# Patient Record
Sex: Male | Born: 1985 | Race: White | Hispanic: No | Marital: Married
Health system: Midwestern US, Community
[De-identification: ages and names within clinical notes are randomized; demographics above are authoritative.]

## PROBLEM LIST (undated history)

## (undated) DIAGNOSIS — J329 Chronic sinusitis, unspecified: Principal | ICD-10-CM

## (undated) HISTORY — PX: HERNIA REPAIR: SHX51

---

## 2014-10-27 ENCOUNTER — Ambulatory Visit (INDEPENDENT_AMBULATORY_CARE_PROVIDER_SITE_OTHER): Payer: BC Managed Care – PPO | Admitting: Family Medicine

## 2014-10-27 VITALS — BP 110/70 | HR 58 | Temp 98.6°F | Resp 16 | Ht 67.5 in | Wt 171.6 lb

## 2014-10-27 DIAGNOSIS — Z0000 Encounter for general adult medical examination without abnormal findings: Secondary | ICD-10-CM

## 2014-10-27 DIAGNOSIS — Z23 Encounter for immunization: Secondary | ICD-10-CM

## 2014-10-27 NOTE — Progress Notes (Signed)
Employment physical examination:  History: Patient is here for physical examination. He said that his job wanted him to have a physical exam. They need a note saying that he had it. He was not given any form to complete.  Past medical history: Generally has been healthy with no major medical illnesses. No surgeries. No regular medications. No allergies.  Family history: Mother has hypertension. She is living in [place]. His father and siblings are all living and well, also in their home country  Social history: Patient emigrated here from [place] 2 years ago. His wife is also here, pregnant and due in a few months. He works as a Arboriculturistequipment operator. He does not smoke, drink, or use drugs. He has a college education and speaks English well.  Review of systems: Constitutional: Unremarkable HEENT: Unremarkable Eyes: Unremarkable Respiratory: Unremarkable Cardiovascular: Unremarkable Gastrointestinal: Unremarkable Endocrine: Unremarkable Genitourinary: Unremarkable Musculoskeletal: Unremarkable Dermatologic: Unremarkable Allergy/immunology: Unremarkable Neurology: Unremarkable Hematologic: Unremarkable Psychiatric: Unremarkable   Physical examination: Healthy-appearing young man in no acute distress. His TMs are normal. Eyes PERRLA. EOMs intact. Throat clear. He has a need of some dental work for some cavities. His neck was supple without nodes or thyromegaly. No carotid bruits. Chest clear to auscultation. Heart regular without murmurs gallops or arrhythmias. Abdomen soft without mass or tenderness. No axillary or inguinal nodes. Normal male external genitalia with testes descended. No hernias. Rectal exam not done. Extremities unremarkable. Skin unremarkable except for a tiny blue nevus on his right forehead.  Assessment: Normal physical examination Dental caries  Plan: Check lipids and Cmet. Order flu shot. Return annually or as needed.

## 2014-10-27 NOTE — Patient Instructions (Signed)
Flu shot today  We will let you know the results of your labs  Return as necessary.

## 2014-10-28 LAB — COMPREHENSIVE METABOLIC PANEL
ALBUMIN: 4.4 g/dL (ref 3.5–5.2)
ALK PHOS: 79 U/L (ref 39–117)
ALT: 13 U/L (ref 0–53)
AST: 17 U/L (ref 0–37)
BILIRUBIN TOTAL: 0.5 mg/dL (ref 0.2–1.2)
BUN: 12 mg/dL (ref 6–23)
CO2: 26 mEq/L (ref 19–32)
Calcium: 9.4 mg/dL (ref 8.4–10.5)
Chloride: 101 mEq/L (ref 96–112)
Creat: 0.7 mg/dL (ref 0.50–1.35)
Glucose, Bld: 82 mg/dL (ref 70–99)
POTASSIUM: 4.5 meq/L (ref 3.5–5.3)
Sodium: 137 mEq/L (ref 135–145)
Total Protein: 7.5 g/dL (ref 6.0–8.3)

## 2014-10-28 LAB — LIPID PANEL
CHOL/HDL RATIO: 2.9 ratio
Cholesterol: 113 mg/dL (ref 0–200)
HDL: 39 mg/dL — AB (ref >39)
LDL CALC: 48 mg/dL (ref 0–99)
TRIGLYCERIDES: 131 mg/dL (ref <150)
VLDL: 26 mg/dL (ref 0–40)

## 2016-07-28 DIAGNOSIS — E876 Hypokalemia: Secondary | ICD-10-CM | POA: Diagnosis not present

## 2016-07-28 DIAGNOSIS — J189 Pneumonia, unspecified organism: Secondary | ICD-10-CM | POA: Diagnosis not present

## 2016-07-28 DIAGNOSIS — A419 Sepsis, unspecified organism: Principal | ICD-10-CM | POA: Insufficient documentation

## 2016-07-28 DIAGNOSIS — R509 Fever, unspecified: Secondary | ICD-10-CM | POA: Diagnosis present

## 2016-07-29 ENCOUNTER — Emergency Department (HOSPITAL_COMMUNITY): Payer: BLUE CROSS/BLUE SHIELD

## 2016-07-29 ENCOUNTER — Inpatient Hospital Stay (HOSPITAL_COMMUNITY): Payer: BLUE CROSS/BLUE SHIELD

## 2016-07-29 ENCOUNTER — Encounter (HOSPITAL_COMMUNITY): Payer: Self-pay | Admitting: *Deleted

## 2016-07-29 ENCOUNTER — Observation Stay (HOSPITAL_COMMUNITY)
Admission: EM | Admit: 2016-07-29 | Discharge: 2016-07-31 | Disposition: A | Discharge status: Home / Self Care | Payer: BLUE CROSS/BLUE SHIELD | Attending: Internal Medicine | Admitting: Internal Medicine

## 2016-07-29 DIAGNOSIS — A419 Sepsis, unspecified organism: Secondary | ICD-10-CM | POA: Diagnosis present

## 2016-07-29 DIAGNOSIS — J029 Acute pharyngitis, unspecified: Secondary | ICD-10-CM | POA: Diagnosis present

## 2016-07-29 DIAGNOSIS — E876 Hypokalemia: Secondary | ICD-10-CM | POA: Diagnosis not present

## 2016-07-29 DIAGNOSIS — J189 Pneumonia, unspecified organism: Secondary | ICD-10-CM

## 2016-07-29 DIAGNOSIS — R509 Fever, unspecified: Secondary | ICD-10-CM

## 2016-07-29 LAB — RESPIRATORY PANEL BY PCR
Adenovirus: NOT DETECTED
BORDETELLA PERTUSSIS-RVPCR: NOT DETECTED
CORONAVIRUS HKU1-RVPPCR: NOT DETECTED
CORONAVIRUS NL63-RVPPCR: NOT DETECTED
CORONAVIRUS OC43-RVPPCR: NOT DETECTED
Chlamydophila pneumoniae: NOT DETECTED
Coronavirus 229E: NOT DETECTED
INFLUENZA A H1 2009-RVPPR: NOT DETECTED
INFLUENZA A H3-RVPPCR: NOT DETECTED
INFLUENZA B-RVPPCR: NOT DETECTED
Influenza A H1: NOT DETECTED
Influenza A: NOT DETECTED
METAPNEUMOVIRUS-RVPPCR: NOT DETECTED
MYCOPLASMA PNEUMONIAE-RVPPCR: NOT DETECTED
PARAINFLUENZA VIRUS 1-RVPPCR: NOT DETECTED
PARAINFLUENZA VIRUS 2-RVPPCR: NOT DETECTED
PARAINFLUENZA VIRUS 3-RVPPCR: NOT DETECTED
Parainfluenza Virus 4: NOT DETECTED
RHINOVIRUS / ENTEROVIRUS - RVPPCR: NOT DETECTED
Respiratory Syncytial Virus: NOT DETECTED

## 2016-07-29 LAB — CBC WITH DIFFERENTIAL/PLATELET
BASOS PCT: 0 %
Basophils Absolute: 0 10*3/uL (ref 0.0–0.1)
EOS ABS: 0.2 10*3/uL (ref 0.0–0.7)
Eosinophils Relative: 1 %
HCT: 38.3 % — ABNORMAL LOW (ref 39.0–52.0)
HEMOGLOBIN: 13.4 g/dL (ref 13.0–17.0)
Lymphocytes Relative: 13 %
Lymphs Abs: 1.9 10*3/uL (ref 0.7–4.0)
MCH: 28.9 pg (ref 26.0–34.0)
MCHC: 35 g/dL (ref 30.0–36.0)
MCV: 82.5 fL (ref 78.0–100.0)
Monocytes Absolute: 0.9 10*3/uL (ref 0.1–1.0)
Monocytes Relative: 6 %
NEUTROS PCT: 80 %
Neutro Abs: 12 10*3/uL — ABNORMAL HIGH (ref 1.7–7.7)
PLATELETS: 267 10*3/uL (ref 150–400)
RBC: 4.64 MIL/uL (ref 4.22–5.81)
RDW: 12.6 % (ref 11.5–15.5)
WBC: 15 10*3/uL — AB (ref 4.0–10.5)

## 2016-07-29 LAB — URINE MICROSCOPIC-ADD ON: WBC UA: NONE SEEN WBC/hpf (ref 0–5)

## 2016-07-29 LAB — COMPREHENSIVE METABOLIC PANEL
ALT: 22 U/L (ref 17–63)
AST: 28 U/L (ref 15–41)
Albumin: 3.9 g/dL (ref 3.5–5.0)
Alkaline Phosphatase: 73 U/L (ref 38–126)
Anion gap: 10 (ref 5–15)
BILIRUBIN TOTAL: 0.4 mg/dL (ref 0.3–1.2)
BUN: 9 mg/dL (ref 6–20)
CO2: 25 mmol/L (ref 22–32)
CREATININE: 0.84 mg/dL (ref 0.61–1.24)
Calcium: 8.6 mg/dL — ABNORMAL LOW (ref 8.9–10.3)
Chloride: 102 mmol/L (ref 101–111)
GFR calc Af Amer: >60 mL/min (ref >60)
Glucose, Bld: 133 mg/dL — ABNORMAL HIGH (ref 65–99)
POTASSIUM: 3.4 mmol/L — AB (ref 3.5–5.1)
Sodium: 137 mmol/L (ref 135–145)
TOTAL PROTEIN: 7.1 g/dL (ref 6.5–8.1)

## 2016-07-29 LAB — EXPECTORATED SPUTUM ASSESSMENT W REFEX TO RESP CULTURE

## 2016-07-29 LAB — URINALYSIS, ROUTINE W REFLEX MICROSCOPIC
Bilirubin Urine: NEGATIVE
Glucose, UA: NEGATIVE mg/dL
Hgb urine dipstick: NEGATIVE
Ketones, ur: NEGATIVE mg/dL
LEUKOCYTES UA: NEGATIVE
NITRITE: NEGATIVE
PROTEIN: 30 mg/dL — AB
Specific Gravity, Urine: 1.021 (ref 1.005–1.030)
pH: 6 (ref 5.0–8.0)

## 2016-07-29 LAB — PARASITE EXAM SCREEN, BLOOD-W CONF TO LABCORP (NOT @ ARMC)

## 2016-07-29 LAB — I-STAT CG4 LACTIC ACID, ED
LACTIC ACID, VENOUS: 3.58 mmol/L — AB (ref 0.5–1.9)
Lactic Acid, Venous: 3.16 mmol/L (ref 0.5–1.9)

## 2016-07-29 LAB — EXPECTORATED SPUTUM ASSESSMENT W GRAM STAIN, RFLX TO RESP C

## 2016-07-29 LAB — PROTIME-INR
INR: 1.06
Prothrombin Time: 13.8 seconds (ref 11.4–15.2)

## 2016-07-29 LAB — LACTIC ACID, PLASMA
Lactic Acid, Venous: 2.1 mmol/L (ref 0.5–1.9)
Lactic Acid, Venous: 2.1 mmol/L (ref 0.5–1.9)

## 2016-07-29 LAB — MAGNESIUM: Magnesium: 1.8 mg/dL (ref 1.7–2.4)

## 2016-07-29 LAB — PROCALCITONIN: PROCALCITONIN: 2.18 ng/mL

## 2016-07-29 LAB — SAVE SMEAR

## 2016-07-29 LAB — GLUCOSE, CAPILLARY: GLUCOSE-CAPILLARY: 111 mg/dL — AB (ref 65–99)

## 2016-07-29 MED ORDER — ACETAMINOPHEN 650 MG RE SUPP: 650.0000 mg | Freq: Four times a day (QID) | RECTAL | Status: DC | PRN

## 2016-07-29 MED ORDER — IBUPROFEN 400 MG PO TABS
ORAL_TABLET | ORAL | Status: AC
  Filled 2016-07-29: qty 2

## 2016-07-29 MED ORDER — ENOXAPARIN SODIUM 40 MG/0.4ML ~~LOC~~ SOLN
40.0000 mg | Freq: Every day | SUBCUTANEOUS | Status: DC
Start: 2016-07-29 — End: 2016-07-31
  Administered 2016-07-29 – 2016-07-31 (×3): 40 mg via SUBCUTANEOUS
  Filled 2016-07-29 (×3): qty 0.4

## 2016-07-29 MED ORDER — PIPERACILLIN-TAZOBACTAM 3.375 G IVPB
3.3750 g | Freq: Three times a day (TID) | INTRAVENOUS | Status: DC
Start: 2016-07-29 — End: 2016-07-30
  Administered 2016-07-29 – 2016-07-30 (×3): 3.375 g via INTRAVENOUS
  Filled 2016-07-29 (×6): qty 50

## 2016-07-29 MED ORDER — VANCOMYCIN HCL 10 G IV SOLR
1250.0000 mg | Freq: Two times a day (BID) | INTRAVENOUS | Status: DC
  Administered 2016-07-29 – 2016-07-30 (×3): 1250 mg via INTRAVENOUS
  Filled 2016-07-29 (×4): qty 1250

## 2016-07-29 MED ORDER — SODIUM CHLORIDE 0.9 % IV SOLN: 12.0000 mg/kg | Freq: Three times a day (TID) | INTRAVENOUS | Status: DC

## 2016-07-29 MED ORDER — POLYETHYLENE GLYCOL 3350 17 G PO PACK: 17.0000 g | PACK | Freq: Every day | ORAL | Status: DC | PRN

## 2016-07-29 MED ORDER — SODIUM CHLORIDE 0.9 % IV SOLN: 24.0000 mg/kg | Freq: Once | INTRAVENOUS | Status: DC

## 2016-07-29 MED ORDER — SODIUM CHLORIDE 0.9% FLUSH
3.0000 mL | Freq: Two times a day (BID) | INTRAVENOUS | Status: DC
  Administered 2016-07-29 – 2016-07-31 (×4): 3 mL via INTRAVENOUS

## 2016-07-29 MED ORDER — ONDANSETRON HCL 4 MG/2ML IJ SOLN: 4.0000 mg | Freq: Four times a day (QID) | INTRAMUSCULAR | Status: DC | PRN

## 2016-07-29 MED ORDER — POTASSIUM CHLORIDE IN NACL 20-0.9 MEQ/L-% IV SOLN
INTRAVENOUS | Status: AC
  Administered 2016-07-29: 1000 mL via INTRAVENOUS
  Filled 2016-07-29: qty 1000

## 2016-07-29 MED ORDER — KETOROLAC TROMETHAMINE 30 MG/ML IJ SOLN: 30.0000 mg | Freq: Four times a day (QID) | INTRAMUSCULAR | Status: DC | PRN

## 2016-07-29 MED ORDER — SODIUM CHLORIDE 0.9 % IV BOLUS (SEPSIS)
500.0000 mL | Freq: Once | INTRAVENOUS | Status: AC
  Administered 2016-07-29: 500 mL via INTRAVENOUS

## 2016-07-29 MED ORDER — PIPERACILLIN-TAZOBACTAM 3.375 G IVPB 30 MIN: 3.3750 g | Freq: Once | INTRAVENOUS | Status: DC

## 2016-07-29 MED ORDER — PIPERACILLIN-TAZOBACTAM 3.375 G IVPB 30 MIN
3.3750 g | Freq: Once | INTRAVENOUS | Status: AC
  Administered 2016-07-29: 3.375 g via INTRAVENOUS
  Filled 2016-07-29: qty 50

## 2016-07-29 MED ORDER — ACETAMINOPHEN 325 MG PO TABS: 650.0000 mg | ORAL_TABLET | Freq: Four times a day (QID) | ORAL | Status: DC | PRN

## 2016-07-29 MED ORDER — ONDANSETRON HCL 4 MG PO TABS: 4.0000 mg | ORAL_TABLET | Freq: Four times a day (QID) | ORAL | Status: DC | PRN

## 2016-07-29 MED ORDER — VANCOMYCIN HCL IN DEXTROSE 1-5 GM/200ML-% IV SOLN: 1000.0000 mg | Freq: Once | INTRAVENOUS | Status: DC

## 2016-07-29 MED ORDER — SODIUM CHLORIDE 0.9 % IV BOLUS (SEPSIS)
1000.0000 mL | Freq: Once | INTRAVENOUS | Status: AC
  Administered 2016-07-29: 1000 mL via INTRAVENOUS

## 2016-07-29 MED ORDER — IBUPROFEN 400 MG PO TABS
800.0000 mg | ORAL_TABLET | Freq: Once | ORAL | Status: AC
  Administered 2016-07-29: 800 mg via ORAL

## 2016-07-29 MED ORDER — BISACODYL 5 MG PO TBEC: 5.0000 mg | DELAYED_RELEASE_TABLET | Freq: Every day | ORAL | Status: DC | PRN

## 2016-07-29 MED ORDER — VANCOMYCIN HCL IN DEXTROSE 1-5 GM/200ML-% IV SOLN
1000.0000 mg | Freq: Once | INTRAVENOUS | Status: AC
  Administered 2016-07-29: 1000 mg via INTRAVENOUS
  Filled 2016-07-29: qty 200

## 2016-07-29 MED ORDER — HYDROCODONE-ACETAMINOPHEN 5-325 MG PO TABS: 1.0000 | ORAL_TABLET | ORAL | Status: DC | PRN

## 2016-07-29 MED ORDER — DEXTROSE 5 % IV SOLN
100.0000 mg | Freq: Once | INTRAVENOUS | Status: AC
  Administered 2016-07-29: 100 mg via INTRAVENOUS
  Filled 2016-07-29: qty 100

## 2016-07-29 NOTE — ED Notes (Signed)
Elevated Lactic Acid called from Lab.  Dr. Mora Bellmanni made aware

## 2016-07-29 NOTE — ED Triage Notes (Signed)
Patient presents with c/o generalized weakness, fever and cough

## 2016-07-29 NOTE — H&P (Signed)
History and Physical    Wayne Keller ZDG:[id number] DOB: 1986-06-18 DOA: 07/29/2016  PCP: No PCP Per Patient   Patient coming from: Home   Chief Complaint: Sore throat, cough, fever   HPI: Wayne Keller is a generally healthy 30 y.o. male who denies any significant past medical history and presents to the emergency department for evaluation of fever, sore throat, and cough of one day duration. Patient reports being in his usual state of health, having just returned from visiting family in [place] on 07/25/2016, when he experienced the development of fever with sore throat and occasional cough early on the day of his presentation. He describes the cough is nonproductive, mild, and not associated with any chest pain or dyspnea. Sore throat is described as moderate in severity and with associated tender node in the left neck. He has no difficulty swallowing or speaking with this. He denies sick contacts. He reports receiving typhoid vaccination as a child and again a couple years ago. He did not use malaria prophylaxis on his recent trip to [place]. He grew up in [place] but denies any history of malaria or recent mosquito bite, and reports that his family who continues to live in [place] has never had malaria. Patient denies dyspnea or significant cough, denies abdominal pain, nausea, vomiting, or diarrhea. He also denies dysuria or flank pain. Patient denies rash or headache. There are no red, hot, or swollen joints. He denies any dental pain.  ED Course: Upon arrival to the ED, patient is found to be febrile to 39.5 C, saturating well on room air, and with vitals otherwise stable. Chest x-ray is negative for acute cardiopulmonary disease and chemistry panel is notable only for mild hypokalemia. CBC features a leukocytosis to 15,000 and lactic acid is elevated to 3.58. INR is within the normal limits and urinalysis is notable for a mild proteinuria. Patient was given 30 cc/kg NS bolus and blood and  urine cultures were obtained. He was started on empiric treatment with vancomycin and Zosyn, and given the ED physician's concern for malaria, peripheral smear review was requested and treatment with doxycycline and quinidine was ordered. Apparently, there is only enough quinidine in the hospital for one patient, and there is already a confirmed malaria case. Patient has remained hemodynamically stable with normal mentation and no respiratory distress. He will be observed in the hospital for ongoing evaluation and management of sepsis of unknown origin. If this is in fact malaria, it does not meet criteria for severe malaria and antimalarial treatment can be held for now while awaiting confirmatory testing.  Review of Systems:  All other systems reviewed and apart from HPI, are negative.  History reviewed. No pertinent past medical history.  History reviewed. No pertinent surgical history.   reports that he has never smoked. He has never used smokeless tobacco. He reports that he does not drink alcohol or use drugs.  No Known Allergies  Family History  Problem Relation Age of Onset  . Hypertension Mother      Prior to Admission medications   Not on File    Physical Exam: Vitals:   07/29/16 0330 07/29/16 0345 07/29/16 0400 07/29/16 0415  BP: 102/84 115/60 118/55 113/62  Pulse: 99 88 (!) 57 84  Resp: 21 (!) 29 25 24   Temp:    98.3 F (36.8 C)  TempSrc:    Oral  SpO2: 100% 98% 96% 98%  Weight:      Height:  Constitutional: NAD, calm, comfortable.  Eyes: PERTLA, lids and conjunctivae normal ENMT: Mucous membranes are moist. Posterior pharynx clear of any exudate or lesions.   Neck: normal, supple, no masses, no thyromegaly. 1.5 cm tender mobile node in anterior cervical chain on left. Respiratory: clear to auscultation bilaterally, no wheezing, no crackles. Normal respiratory effort.   Cardiovascular: S1 & S2 heard, regular rate and rhythm, no significant. No extremity  edema. 2+ pedal pulses. No significant JVD. Abdomen: No distension, no tenderness, no masses palpated. Bowel sounds normal.  Musculoskeletal: no clubbing / cyanosis. No joint deformity upper and lower extremities. Normal muscle tone.  Skin: no significant rashes, lesions, ulcers. Warm, dry, well-perfused. Neurologic: CN 2-12 grossly intact. Sensation intact, DTR normal. Strength 5/5 in all 4 limbs.  Psychiatric: Normal judgment and insight. Alert and oriented x 3. Normal mood and affect.     Labs on Admission: I have personally reviewed following labs and imaging studies  CBC:  Recent Labs Lab 07/29/16 0029  WBC 15.0*  NEUTROABS 12.0*  HGB 13.4  HCT 38.3*  MCV 82.5  PLT 267   Basic Metabolic Panel:  Recent Labs Lab 07/29/16 0028  NA 137  K 3.4*  CL 102  CO2 25  GLUCOSE 133*  BUN 9  CREATININE 0.84  CALCIUM 8.6*   GFR: Estimated Creatinine Clearance: 125.3 mL/min (by C-G formula based on SCr of 0.84 mg/dL). Liver Function Tests:  Recent Labs Lab 07/29/16 0028  AST 28  ALT 22  ALKPHOS 73  BILITOT 0.4  PROT 7.1  ALBUMIN 3.9   No results for input(s): LIPASE, AMYLASE in the last 168 hours. No results for input(s): AMMONIA in the last 168 hours. Coagulation Profile:  Recent Labs Lab 07/29/16 0155  INR 1.06   Cardiac Enzymes: No results for input(s): CKTOTAL, CKMB, CKMBINDEX, TROPONINI in the last 168 hours. BNP (last 3 results) No results for input(s): PROBNP in the last 8760 hours. HbA1C: No results for input(s): HGBA1C in the last 72 hours. CBG: No results for input(s): GLUCAP in the last 168 hours. Lipid Profile: No results for input(s): CHOL, HDL, LDLCALC, TRIG, CHOLHDL, LDLDIRECT in the last 72 hours. Thyroid Function Tests: No results for input(s): TSH, T4TOTAL, FREET4, T3FREE, THYROIDAB in the last 72 hours. Anemia Panel: No results for input(s): VITAMINB12, FOLATE, FERRITIN, TIBC, IRON, RETICCTPCT in the last 72 hours. Urine analysis:      Component Value Date/Time   COLORURINE YELLOW 07/29/2016 0044   APPEARANCEUR CLOUDY (A) 07/29/2016 0044   LABSPEC 1.021 07/29/2016 0044   PHURINE 6.0 07/29/2016 0044   GLUCOSEU NEGATIVE 07/29/2016 0044   HGBUR NEGATIVE 07/29/2016 0044   BILIRUBINUR NEGATIVE 07/29/2016 0044   KETONESUR NEGATIVE 07/29/2016 0044   PROTEINUR 30 (A) 07/29/2016 0044   NITRITE NEGATIVE 07/29/2016 0044   LEUKOCYTESUR NEGATIVE 07/29/2016 0044   Sepsis Labs: @LABRCNTIP (procalcitonin:4,lacticidven:4) )No results found for this or any previous visit (from the past 240 hour(s)).   Radiological Exams on Admission: Dg Chest 2 View  Result Date: 07/29/2016 CLINICAL DATA:  Fever, congestion and cough for 2 days. EXAM: CHEST  2 VIEW COMPARISON:  None. FINDINGS: The heart size and mediastinal contours are within normal limits. Both lungs are clear. The visualized skeletal structures are unremarkable. IMPRESSION: No acute cardiopulmonary process. Electronically Signed   By: Awilda Metroourtnay  Bloomer M.D.   On: 07/29/2016 00:59    EKG: Not performed, will obtain as appropriate.   Assessment/Plan  1. Sepsis with unknown source  - Presents with fever to 39.5  C, leukocytosis, lactate 3.6 - 30 cc/kg bolus given - Blood and urine cultures incubating  - Empiric vancomycin, Zosyn, and doxycycline administered in ED - Only localizing s/s are sore throat and a tender node in the left anterior cervical chain - Suspect this is likely a viral syndrome, possibly acute pharyngitis, but with the sepsis picture will continue empiric treatment with vanc and Zosyn for now while following cultures  - Trend lactate and PCT - Check respiratory virus panel  - Check peripheral smear for malaria, ordered  - Infection-control with droplet precautions   2. Hypokalemia  - Serum potassium 3.4 on admission  - Supplemental potassium added to IVF  - Check mag level and replete prn     DVT prophylaxis: sq Lovenox  Code Status: Full  Family  Communication: Discussed with patient Disposition Plan: Admit to telemetry  Consults called: None Admission status: Inpatient    Briscoe Deutscher, MD [facility] Pager [phone]  If 7PM-7AM, please contact night-coverage [email or web address] Password [email or web address]  07/29/2016, 4:31 AM

## 2016-07-29 NOTE — ED Provider Notes (Signed)
[facility]-EMERGENCY DEPT Provider Note   CSN: [id number] Arrival date & time: 07/28/16  2349  First Provider Contact:  First MD Initiated Contact with Patient 07/29/16 0111        History   Chief Complaint Chief Complaint  Patient presents with  . Fever  . Cough    HPI Wayne Keller is a 30 y.o. male.  Wayne Keller is a 30 y.o. male with no major medical history presents to the Emergency Department complaining of gradual, persistent, progressively worsening fever, rhinorrhea, sore throat, cough, myalgias, nausea and vomiting onset last night. She reports 3 episodes of nonbloody nonbilious emesis. Patient denies abdominal pain, diarrhea. He reports Tylenol usage for his fever with some improvement. Patient reports that he returned from [place] on 07/25/2016. He denies taking Typhoid or malaria prophylaxis prior to travel.  Patient denies known sick contacts. He reports he is otherwise up-to-date on his vaccines. No aggravating factors.    The history is provided by the patient and medical records. No language interpreter was used.    History reviewed. No pertinent past medical history.  Patient Active Problem List   Diagnosis Date Noted  . Sepsis (HCC) 07/29/2016    History reviewed. No pertinent surgical history.     Home Medications    Prior to Admission medications   Not on File    Family History Family History  Problem Relation Age of Onset  . Hypertension Mother     Social History Social History  Substance Use Topics  . Smoking status: Never Smoker  . Smokeless tobacco: Never Used  . Alcohol use No     Allergies   Review of patient's allergies indicates no known allergies.   Review of Systems Review of Systems  Constitutional: Positive for chills and fever.  HENT: Positive for congestion, rhinorrhea and sore throat.   Respiratory: Positive for cough.   Gastrointestinal: Positive for nausea and vomiting. Negative for diarrhea.  Musculoskeletal:  Positive for myalgias.     Physical Exam Updated Vital Signs BP 102/84   Pulse 99   Temp 99.9 F (37.7 C) (Oral)   Resp 21   Ht 5\' 6"  (1.676 m)   Wt 78 kg   SpO2 100%   BMI 27.75 kg/m   Physical Exam  Constitutional: He appears well-developed and well-nourished. No distress.  Awake, alert, nontoxic appearance  HENT:  Head: Normocephalic and atraumatic.  Right Ear: Tympanic membrane, external ear and ear canal normal.  Left Ear: Tympanic membrane, external ear and ear canal normal.  Nose: Mucosal edema and rhinorrhea present. No epistaxis. Right sinus exhibits no maxillary sinus tenderness and no frontal sinus tenderness. Left sinus exhibits no maxillary sinus tenderness and no frontal sinus tenderness.  Mouth/Throat: Uvula is midline, oropharynx is clear and moist and mucous membranes are normal. Mucous membranes are not pale and not cyanotic. No oropharyngeal exudate, posterior oropharyngeal edema, posterior oropharyngeal erythema or tonsillar abscesses.  Eyes: Conjunctivae are normal. Pupils are equal, round, and reactive to light. No scleral icterus.  Neck: Normal range of motion and full passive range of motion without pain. Neck supple. No spinous process tenderness and no muscular tenderness present. No Brudzinski's sign and no Kernig's sign noted.  No nuchal rigidity or meningeal signs  Cardiovascular: Regular rhythm and intact distal pulses.  Tachycardia present.   Pulses:      Radial pulses are 2+ on the right side, and 2+ on the left side.       Dorsalis pedis pulses are 2+  on the right side, and 2+ on the left side.  Mild tachycardia  Pulmonary/Chest: Effort normal and breath sounds normal. No stridor. No respiratory distress. He has no wheezes.  Clear and equal breath sounds without focal wheezes, rhonchi, rales  Abdominal: Soft. Bowel sounds are normal. He exhibits no mass. There is no tenderness. There is no rebound and no guarding.  Musculoskeletal: Normal range of  motion. He exhibits no edema.  Lymphadenopathy:    He has no cervical adenopathy.  Neurological: He is alert.  Speech is clear and goal oriented Moves extremities without ataxia  Skin: Skin is warm and dry. No petechiae, no purpura and no rash noted. He is not diaphoretic.  Psychiatric: He has a normal mood and affect.  Nursing note and vitals reviewed.    ED Treatments / Results  Labs (all labs ordered are listed, but only abnormal results are displayed) Labs Reviewed  COMPREHENSIVE METABOLIC PANEL - Abnormal; Notable for the following:       Result Value   Potassium 3.4 (*)    Glucose, Bld 133 (*)    Calcium 8.6 (*)    All other components within normal limits  URINALYSIS, ROUTINE W REFLEX MICROSCOPIC (NOT AT [facility]) - Abnormal; Notable for the following:    APPearance CLOUDY (*)    Protein, ur 30 (*)    All other components within normal limits  CBC WITH DIFFERENTIAL/PLATELET - Abnormal; Notable for the following:    WBC 15.0 (*)    HCT 38.3 (*)    Neutro Abs 12.0 (*)    All other components within normal limits  URINE MICROSCOPIC-ADD ON - Abnormal; Notable for the following:    Squamous Epithelial / LPF 0-5 (*)    Bacteria, UA RARE (*)    Casts HYALINE CASTS (*)    All other components within normal limits  I-STAT CG4 LACTIC ACID, ED - Abnormal; Notable for the following:    Lactic Acid, Venous 3.58 (*)    All other components within normal limits  I-STAT CG4 LACTIC ACID, ED - Abnormal; Notable for the following:    Lactic Acid, Venous 3.16 (*)    All other components within normal limits  CULTURE, BLOOD (ROUTINE X 2)  CULTURE, BLOOD (ROUTINE X 2)  URINE CULTURE  PARASITE EXAM SCREEN, BLOOD-W CONF TO [facility] (NOT @ [facility])  PROTIME-INR  SAVE SMEAR  PATHOLOGIST SMEAR REVIEW  PARASITE EXAM, BLOOD    EKG  EKG Interpretation None       Radiology Dg Chest 2 View  Result Date: 07/29/2016 CLINICAL DATA:  Fever, congestion and cough for 2 days. EXAM: CHEST  2 VIEW  COMPARISON:  None. FINDINGS: The heart size and mediastinal contours are within normal limits. Both lungs are clear. The visualized skeletal structures are unremarkable. IMPRESSION: No acute cardiopulmonary process. Electronically Signed   By: Awilda Metro M.D.   On: 07/29/2016 00:59    Procedures Procedures (including critical care time)  Medications Ordered in ED Medications  ibuprofen (ADVIL,MOTRIN) 400 MG tablet (not administered)  quiNIDine gluconate 1,872 mg in sodium chloride 0.9 % 250 mL IVPB (0 mg Intravenous Hold 07/29/16 0333)    Followed by  quiNIDine gluconate 936 mg in sodium chloride 0.9 % 250 mL IVPB (not administered)  doxycycline (VIBRAMYCIN) 100 mg in dextrose 5 % 250 mL IVPB (100 mg Intravenous New Bag/Given 07/29/16 0328)  ibuprofen (ADVIL,MOTRIN) tablet 800 mg (800 mg Oral Given 07/29/16 0009)  sodium chloride 0.9 % bolus 1,000 mL (1,000 mLs  Intravenous New Bag/Given 07/29/16 0235)    And  sodium chloride 0.9 % bolus 1,000 mL (0 mLs Intravenous Stopped 07/29/16 0329)    And  sodium chloride 0.9 % bolus 500 mL (500 mLs Intravenous New Bag/Given 07/29/16 0248)  piperacillin-tazobactam (ZOSYN) IVPB 3.375 g (0 g Intravenous Stopped 07/29/16 0318)  vancomycin (VANCOCIN) IVPB 1000 mg/200 mL premix (1,000 mg Intravenous New Bag/Given 07/29/16 0235)     Initial Impression / Assessment and Plan / ED Course  I have reviewed the triage vital signs and the nursing notes.  Pertinent labs & imaging results that were available during my care of the patient were reviewed by me and considered in my medical decision making (see chart for details).  Clinical Course  Value Comment By Time  Lactic Acid, Venous: (!!) 3.58 Elevated lactic Danecia Underdown, PA-C 08/06 0112  WBC: (!) 15.0 Leukocytosis Dierdre Forth, PA-C 08/06 0112  DG Chest 2 View No Pneumonia Dierdre Forth, PA-C 08/06 0112  Temp: (!) 103.1 F (39.5 C) fever Briseida Gittings, PA-C 08/06 0112  Leukocytes, UA:  NEGATIVE No evidence of urinary tract infection. Dierdre Forth, PA-C 08/06 0224   Discussed with pharmacist Fayrene Fearing [phone]) who requests ID consult prior to beginning quinidine.   Dierdre Forth, PA-C 08/06 0236   Discussed with Dr. Antionette Char who will admit to tele inpatient.  Will hold on malaria treatment until confirmation.   Dahlia Client Kaylinn Dedic, PA-C 08/06 [phone]    Pt With febrile illness and elevated lactic acid after recent international travel. Suspect malaria. Patient is receiving sepsis protocol and weight-based fluid resuscitation. No history of CHF. Malaria treatment initiated for resistant species.  Pt will need admission.    Final Clinical Impressions(s) / ED Diagnoses   Final diagnoses:  Fever, unspecified fever cause  Sepsis, due to unspecified organism [facility])    New Prescriptions New Prescriptions   No medications on file     [facility], PA-C 07/29/16 0349    Tomasita Crumble, MD 07/29/16 [phone]

## 2016-07-29 NOTE — Progress Notes (Signed)
Patient ID: Wayne Keller, male   DOB: 1986-09-06, 30 y.o.   MRN: [id number] Pt admitted after midnight. Please refer to admission note done 07/29/2016 for details.  Pt is 30 year old male with no significant past medical history. He returned from visiting family in [place] on 07/25/2016. Shortly thereafter he developed fever, sore throat and nonproductive cough of one day duration. He reported receiving typhoid vaccination as a child and again a couple years ago. He did not use malaria prophylaxis on his recent trip to [place]. No reprots of rash. No headaches. No mental status changes.   In ED, T max was 103.1 F, HR 57, RR 18-33. Blood cultures obtained, peripheral smear sent.  Assessment and Plan:  Fever with relative bradycardia - Differential includes typhoid, legionella; not so sure that this would be malaria considering absence of thrombocytopenia, hyponatremia, rash - Relative bradycardia for pt fever of 103 is concerning for typhoid and legionella in this pt traveling to endemic area - Order placed for CT chest for evaluation of his cough as CXR may not provide a good diagnostic yield - Obtain sputum culture - Obtain legionella UA antigen - Follow up on peripheral smear result - Continue current antibiotics for sepsis coverage - F/U blood culture results  Manson Passeylma Shamarie Call [facility] [phone]

## 2016-07-29 NOTE — Progress Notes (Signed)
Pharmacy Antibiotic Note  Wayne Keller is a 30 y.o. male admitted on 07/29/2016 with fever/cough, possible sepsis.  Pharmacy has been consulted for Vancomycin and Zosyn dosing.  Vancomycin 1 g IV given in ED at  0230  Plan: Vancomycin 1250  mg IV q12h Zosyn 3.375 g IV q8h   Height: 5\' 6"  (167.6 cm) Weight: 171 lb 15.3 oz (78 kg) IBW/kg (Calculated) : 63.8  Temp (24hrs), Avg:100.4 F (38 C), Min:98.3 F (36.8 C), Max:103.1 F (39.5 C)   Recent Labs Lab 07/29/16 0028 07/29/16 0029 07/29/16 0052 07/29/16 0249  WBC  --  15.0*  --   --   CREATININE 0.84  --   --   --   LATICACIDVEN  --   --  3.58* 3.16*    Estimated Creatinine Clearance: 125.3 mL/min (by C-G formula based on SCr of 0.84 mg/dL).    No Known Allergies   Wayne Keller, Wayne Keller 07/29/2016 4:46 AM

## 2016-07-30 ENCOUNTER — Observation Stay (HOSPITAL_COMMUNITY): Payer: BLUE CROSS/BLUE SHIELD

## 2016-07-30 ENCOUNTER — Encounter (HOSPITAL_COMMUNITY): Payer: Self-pay | Admitting: Radiology

## 2016-07-30 DIAGNOSIS — R001 Bradycardia, unspecified: Secondary | ICD-10-CM

## 2016-07-30 DIAGNOSIS — R509 Fever, unspecified: Secondary | ICD-10-CM

## 2016-07-30 DIAGNOSIS — R05 Cough: Secondary | ICD-10-CM

## 2016-07-30 DIAGNOSIS — E876 Hypokalemia: Secondary | ICD-10-CM | POA: Diagnosis not present

## 2016-07-30 DIAGNOSIS — J189 Pneumonia, unspecified organism: Secondary | ICD-10-CM

## 2016-07-30 DIAGNOSIS — D72829 Elevated white blood cell count, unspecified: Secondary | ICD-10-CM | POA: Diagnosis not present

## 2016-07-30 DIAGNOSIS — A419 Sepsis, unspecified organism: Secondary | ICD-10-CM | POA: Diagnosis not present

## 2016-07-30 DIAGNOSIS — J029 Acute pharyngitis, unspecified: Secondary | ICD-10-CM

## 2016-07-30 LAB — GLUCOSE, CAPILLARY: GLUCOSE-CAPILLARY: 87 mg/dL (ref 65–99)

## 2016-07-30 LAB — CBC WITH DIFFERENTIAL/PLATELET
Basophils Absolute: 0.1 10*3/uL (ref 0.0–0.1)
Basophils Relative: 1 %
EOS PCT: 4 %
Eosinophils Absolute: 0.3 10*3/uL (ref 0.0–0.7)
HCT: 37 % — ABNORMAL LOW (ref 39.0–52.0)
Hemoglobin: 12.8 g/dL — ABNORMAL LOW (ref 13.0–17.0)
LYMPHS ABS: 2.2 10*3/uL (ref 0.7–4.0)
LYMPHS PCT: 29 %
MCH: 28.7 pg (ref 26.0–34.0)
MCHC: 34.6 g/dL (ref 30.0–36.0)
MCV: 83 fL (ref 78.0–100.0)
MONO ABS: 0.9 10*3/uL (ref 0.1–1.0)
MONOS PCT: 12 %
Neutro Abs: 4.2 10*3/uL (ref 1.7–7.7)
Neutrophils Relative %: 54 %
PLATELETS: 245 10*3/uL (ref 150–400)
RBC: 4.46 MIL/uL (ref 4.22–5.81)
RDW: 12.6 % (ref 11.5–15.5)
WBC: 7.7 10*3/uL (ref 4.0–10.5)

## 2016-07-30 LAB — CRYPTOCOCCAL ANTIGEN: CRYPTO AG: NEGATIVE

## 2016-07-30 LAB — URINE CULTURE: CULTURE: NO GROWTH

## 2016-07-30 LAB — PATHOLOGIST SMEAR REVIEW

## 2016-07-30 LAB — HIV ANTIBODY (ROUTINE TESTING W REFLEX): HIV SCREEN 4TH GENERATION: NONREACTIVE

## 2016-07-30 LAB — LEGIONELLA PNEUMOPHILA SEROGP 1 UR AG: L. PNEUMOPHILA SEROGP 1 UR AG: NEGATIVE

## 2016-07-30 MED ORDER — DIATRIZOATE MEGLUMINE & SODIUM 66-10 % PO SOLN
ORAL | Status: AC
  Filled 2016-07-30: qty 30

## 2016-07-30 MED ORDER — AZITHROMYCIN 500 MG PO TABS
250.0000 mg | ORAL_TABLET | Freq: Every day | ORAL | Status: DC
  Administered 2016-07-31: 250 mg via ORAL
  Filled 2016-07-30: qty 1

## 2016-07-30 MED ORDER — AZITHROMYCIN 1 G PO PACK: 1.0000 g | PACK | Freq: Once | ORAL | Status: DC

## 2016-07-30 MED ORDER — AZITHROMYCIN 500 MG PO TABS
500.0000 mg | ORAL_TABLET | Freq: Every day | ORAL | Status: AC
Start: 2016-07-30 — End: 2016-07-30
  Administered 2016-07-30: 500 mg via ORAL
  Filled 2016-07-30: qty 1

## 2016-07-30 MED ORDER — IOPAMIDOL (ISOVUE-300) INJECTION 61%
INTRAVENOUS | Status: AC
Start: 2016-07-30 — End: 2016-07-30
  Administered 2016-07-30: 100 mL
  Filled 2016-07-30: qty 100

## 2016-07-30 NOTE — Consult Note (Signed)
Date of Admission:  07/29/2016  Date of Consult:  07/30/2016  Reason for Consult: FUO, sore throat and cough Referring Physician: Dr. Charlies Silvers   HPI: Wayne Keller is an 30 y.o. male of [place] descent who had just returned from [place] on 07/25/16 where he had been visiting his family with his wife and child. He states that he felt fine while in [place] but became profoundly fatigued upon return from [place] something that he attributed to prolonged trip with frequent delays  In flying back to [place] via [place]. Note he did not travel via [place] going to or from [place].   He then noticed onset of fever, sore throat and dry cough just 1 day prior to presentation to the ED he was febrile to 103 but otherwise fairly bening VS other than a few elevate RR documented.  Patient was seen in the ED where his labs showed WBC of 15k with ANC of 12, no anemia or TTpenia. His CMP normal. His lactic acid was elevated to 3.58. His pro calcitonin was 2.18.  Blood cultures drawn (and no growth x 1 day). Urine culture done despite ZERO urinary symptoms!  He had malaria smear done which was negative. Respiratory panel was negative.  His HIV ab/ag is negative and crypto ag in serum is negative.   He had CT chest which showed patchy tree in bud opacities. I ordered a CT of his abdomen based on severity of his labs and it was completely benign. I have DC his broad spectrum vancomycin and zosyn and changed him to azithromycin.  He tells me that he has had similar to this one and that "everything is always ok."     History reviewed. No pertinent past medical history.  History reviewed. No pertinent surgical history.  Social History:  reports that he has never smoked. He has never used smokeless tobacco. He reports that he does not drink alcohol or use drugs.   Family History  Problem Relation Age of Onset  . Hypertension Mother    No hx of RA, SLE   Not on File   Medications: I have  reviewed patients current medications as documented in Epic Anti-infectives    Start     Dose/Rate Route Frequency Ordered Stop   07/31/16 1000  azithromycin (ZITHROMAX) tablet 250 mg     250 mg Oral Daily 07/30/16 1304 08/04/16 0959   07/30/16 1315  azithromycin (ZITHROMAX) powder 1 g  Status:  Discontinued     1 g Oral  Once 07/30/16 1304 07/30/16 1304   07/30/16 1315  azithromycin (ZITHROMAX) tablet 500 mg     500 mg Oral Daily 07/30/16 1304 07/30/16 1320   07/29/16 1000  piperacillin-tazobactam (ZOSYN) IVPB 3.375 g  Status:  Discontinued     3.375 g 12.5 mL/hr over 240 Minutes Intravenous Every 8 hours 07/29/16 0450 07/30/16 1042   07/29/16 0800  vancomycin (VANCOCIN) 1,250 mg in sodium chloride 0.9 % 250 mL IVPB  Status:  Discontinued     1,250 mg 166.7 mL/hr over 90 Minutes Intravenous Every 12 hours 07/29/16 0450 07/30/16 1042   07/29/16 0445  piperacillin-tazobactam (ZOSYN) IVPB 3.375 g  Status:  Discontinued     3.375 g 100 mL/hr over 30 Minutes Intravenous  Once 07/29/16 0430 07/29/16 0449   07/29/16 0445  vancomycin (VANCOCIN) IVPB 1000 mg/200 mL premix  Status:  Discontinued     1,000 mg 200 mL/hr over 60 Minutes Intravenous  Once 07/29/16 0430  07/29/16 0449   07/29/16 0230  doxycycline (VIBRAMYCIN) 100 mg in dextrose 5 % 250 mL IVPB     100 mg 125 mL/hr over 120 Minutes Intravenous  Once 07/29/16 0215 07/29/16 0528   07/29/16 0145  piperacillin-tazobactam (ZOSYN) IVPB 3.375 g     3.375 g 100 mL/hr over 30 Minutes Intravenous  Once 07/29/16 0138 07/29/16 0318   07/29/16 0145  vancomycin (VANCOCIN) IVPB 1000 mg/200 mL premix     1,000 mg 200 mL/hr over 60 Minutes Intravenous  Once 07/29/16 0138 07/29/16 0335         ROS:as in HPI otherwise remainder of 12 point Review of Systems is negative   Blood pressure (!) 101/51, pulse (!) 59, temperature 97.9 F (36.6 C), temperature source Oral, resp. rate 18, height 5\' 8"  (1.727 m), weight 176 lb (79.8 kg), SpO2 99  %. General: Alert and awake, oriented x3, not in any acute distress. HEENT: anicteric sclera,  EOMI, oropharynx clear and without exudate Cardiovascular: egular rate, normal r,  no murmur rubs or gallops Pulmonary: clear to auscultation bilaterally, no wheezing, rales or rhonchi Gastrointestinal: soft nontender, nondistended, normal bowel sounds, Musculoskeletal: no  clubbing or edema noted bilaterally Skin, soft tissue: no rashes Neuro: nonfocal, strength and sensation intact   Results for orders placed or performed during the hospital encounter of 07/29/16 (from the past 48 hour(s))  Culture, blood (Routine X 2)     Status: None (Preliminary result)   Collection Time: 07/29/16 12:21 AM  Result Value Ref Range   Specimen Description BLOOD LEFT ARM    Special Requests BOTTLES DRAWN AEROBIC AND ANAEROBIC 6CC     Culture NO GROWTH 1 DAY    Report Status PENDING   Comprehensive metabolic panel     Status: Abnormal   Collection Time: 07/29/16 12:28 AM  Result Value Ref Range   Sodium 137 135 - 145 mmol/L   Potassium 3.4 (L) 3.5 - 5.1 mmol/L   Chloride 102 101 - 111 mmol/L   CO2 25 22 - 32 mmol/L   Glucose, Bld 133 (H) 65 - 99 mg/dL   BUN 9 6 - 20 mg/dL   Creatinine, Ser 09/28/16 0.61 - 1.24 mg/dL   Calcium 8.6 (L) 8.9 - 10.3 mg/dL   Total Protein 7.1 6.5 - 8.1 g/dL   Albumin 3.9 3.5 - 5.0 g/dL   AST 28 15 - 41 U/L   ALT 22 17 - 63 U/L   Alkaline Phosphatase 73 38 - 126 U/L   Total Bilirubin 0.4 0.3 - 1.2 mg/dL   GFR calc non Af Amer >60 >60 mL/min   GFR calc Af Amer >60 >60 mL/min    Comment: (NOTE) The eGFR has been calculated using the CKD EPI equation. This calculation has not been validated in all clinical situations. eGFR's persistently <60 mL/min signify possible Chronic Kidney Disease.    Anion gap 10 5 - 15  CBC with Differential     Status: Abnormal   Collection Time: 07/29/16 12:29 AM  Result Value Ref Range   WBC 15.0 (H) 4.0 - 10.5 K/uL   RBC 4.64 4.22 - 5.81  MIL/uL   Hemoglobin 13.4 13.0 - 17.0 g/dL   HCT 09/28/16 (L) 13.6 - 85.9 %   MCV 82.5 78.0 - 100.0 fL   MCH 28.9 26.0 - 34.0 pg   MCHC 35.0 30.0 - 36.0 g/dL   RDW 92.3 41.4 - 43.6 %   Platelets 267 150 - 400 K/uL   Neutrophils  Relative % 80 %   Neutro Abs 12.0 (H) 1.7 - 7.7 K/uL   Lymphocytes Relative 13 %   Lymphs Abs 1.9 0.7 - 4.0 K/uL   Monocytes Relative 6 %   Monocytes Absolute 0.9 0.1 - 1.0 K/uL   Eosinophils Relative 1 %   Eosinophils Absolute 0.2 0.0 - 0.7 K/uL   Basophils Relative 0 %   Basophils Absolute 0.0 0.0 - 0.1 K/uL  Culture, blood (Routine X 2)     Status: None (Preliminary result)   Collection Time: 07/29/16 12:34 AM  Result Value Ref Range   Specimen Description BLOOD LEFT ARM    Special Requests IN PEDIATRIC BOTTLE 4CC    Culture NO GROWTH 1 DAY    Report Status PENDING   Urinalysis, Routine w reflex microscopic     Status: Abnormal   Collection Time: 07/29/16 12:44 AM  Result Value Ref Range   Color, Urine YELLOW YELLOW   APPearance CLOUDY (A) CLEAR   Specific Gravity, Urine 1.021 1.005 - 1.030   pH 6.0 5.0 - 8.0   Glucose, UA NEGATIVE NEGATIVE mg/dL   Hgb urine dipstick NEGATIVE NEGATIVE   Bilirubin Urine NEGATIVE NEGATIVE   Ketones, ur NEGATIVE NEGATIVE mg/dL   Protein, ur 30 (A) NEGATIVE mg/dL   Nitrite NEGATIVE NEGATIVE   Leukocytes, UA NEGATIVE NEGATIVE  Urine microscopic-add on     Status: Abnormal   Collection Time: 07/29/16 12:44 AM  Result Value Ref Range   Squamous Epithelial / LPF 0-5 (A) NONE SEEN   WBC, UA NONE SEEN 0 - 5 WBC/hpf   RBC / HPF 0-5 0 - 5 RBC/hpf   Bacteria, UA RARE (A) NONE SEEN   Casts HYALINE CASTS (A) NEGATIVE  Urine culture     Status: None   Collection Time: 07/29/16 12:45 AM  Result Value Ref Range   Specimen Description URINE, RANDOM    Special Requests NONE    Culture NO GROWTH    Report Status 07/30/2016 FINAL   I-Stat CG4 Lactic Acid, ED     Status: Abnormal   Collection Time: 07/29/16 12:52 AM  Result  Value Ref Range   Lactic Acid, Venous 3.58 (HH) 0.5 - 1.9 mmol/L   Comment NOTIFIED PHYSICIAN   Parasite Exam Screen, Blood-w conf to [facility] (Not @ [facility])     Status: None   Collection Time: 07/29/16  1:55 AM  Result Value Ref Range   Parasite Exam Screen, Blood            Comment: No Plasmodium or other Blood Parasites seen on thin smears. Sent to [facility] for final review of thick and thin smears. RESULT CALLED TO, READ BACK BY AND VERIFIED WITH: M.TOBIAS,RN 0330 07/29/16 M.CAMPBELL   Protime-INR     Status: None   Collection Time: 07/29/16  1:55 AM  Result Value Ref Range   Prothrombin Time 13.8 11.4 - 15.2 seconds   INR 1.06   Save smear     Status: None   Collection Time: 07/29/16  2:18 AM  Result Value Ref Range   Smear Review SMEAR STAINED AND AVAILABLE FOR REVIEW   Pathologist smear review     Status: None   Collection Time: 07/29/16  2:18 AM  Result Value Ref Range   Path Review Leukocytosis with neutrophilia.     Comment: Reviewed by Kalman Drape, M.D. 07/30/16   I-Stat CG4 Lactic Acid, ED     Status: Abnormal   Collection Time: 07/29/16  2:49 AM  Result Value Ref Range   Lactic Acid, Venous 3.16 (HH) 0.5 - 1.9 mmol/L   Comment NOTIFIED PHYSICIAN   Lactic acid, plasma     Status: Abnormal   Collection Time: 07/29/16  4:50 AM  Result Value Ref Range   Lactic Acid, Venous 2.1 (HH) 0.5 - 1.9 mmol/L    Comment: CRITICAL RESULT CALLED TO, READ BACK BY AND VERIFIED WITH: WICKER K,RN 07/29/16 0533 WAYK   Procalcitonin     Status: None   Collection Time: 07/29/16  4:50 AM  Result Value Ref Range   Procalcitonin 2.18 ng/mL    Comment:        Interpretation: PCT > 2 ng/mL: Systemic infection (sepsis) is likely, unless other causes are known. (NOTE)         ICU PCT Algorithm               Non ICU PCT Algorithm    ----------------------------     ------------------------------         PCT < 0.25 ng/mL                 PCT < 0.1 ng/mL     Stopping of  antibiotics            Stopping of antibiotics       strongly encouraged.               strongly encouraged.    ----------------------------     ------------------------------       PCT level decrease by               PCT < 0.25 ng/mL       >= 80% from peak PCT       OR PCT 0.25 - 0.5 ng/mL          Stopping of antibiotics                                             encouraged.     Stopping of antibiotics           encouraged.    ----------------------------     ------------------------------       PCT level decrease by              PCT >= 0.25 ng/mL       < 80% from peak PCT        AND PCT >= 0.5 ng/mL            Continuing antibiotics                                               encouraged.       Continuing antibiotics            encouraged.    ----------------------------     ------------------------------     PCT level increase compared          PCT > 0.5 ng/mL         with peak PCT AND          PCT >= 0.5 ng/mL             Escalation of antibiotics  strongly encouraged.      Escalation of antibiotics        strongly encouraged.   Magnesium     Status: None   Collection Time: 07/29/16  4:50 AM  Result Value Ref Range   Magnesium 1.8 1.7 - 2.4 mg/dL  Respiratory Panel by PCR     Status: None   Collection Time: 07/29/16  5:48 AM  Result Value Ref Range   Adenovirus NOT DETECTED NOT DETECTED   Coronavirus 229E NOT DETECTED NOT DETECTED   Coronavirus HKU1 NOT DETECTED NOT DETECTED   Coronavirus NL63 NOT DETECTED NOT DETECTED   Coronavirus OC43 NOT DETECTED NOT DETECTED   Metapneumovirus NOT DETECTED NOT DETECTED   Rhinovirus / Enterovirus NOT DETECTED NOT DETECTED   Influenza A NOT DETECTED NOT DETECTED   Influenza A H1 NOT DETECTED NOT DETECTED   Influenza A H1 2009 NOT DETECTED NOT DETECTED   Influenza A H3 NOT DETECTED NOT DETECTED   Influenza B NOT DETECTED NOT DETECTED   Parainfluenza Virus 1 NOT DETECTED NOT DETECTED    Parainfluenza Virus 2 NOT DETECTED NOT DETECTED   Parainfluenza Virus 3 NOT DETECTED NOT DETECTED   Parainfluenza Virus 4 NOT DETECTED NOT DETECTED   Respiratory Syncytial Virus NOT DETECTED NOT DETECTED   Bordetella pertussis NOT DETECTED NOT DETECTED   Chlamydophila pneumoniae NOT DETECTED NOT DETECTED   Mycoplasma pneumoniae NOT DETECTED NOT DETECTED  Lactic acid, plasma     Status: Abnormal   Collection Time: 07/29/16  7:24 AM  Result Value Ref Range   Lactic Acid, Venous 2.1 (HH) 0.5 - 1.9 mmol/L    Comment: CRITICAL RESULT CALLED TO, READ BACK BY AND VERIFIED WITH: ANITA MINTZ RN AT 0847 07/29/16 BY WOOLLENK   Glucose, capillary     Status: Abnormal   Collection Time: 07/29/16  7:29 AM  Result Value Ref Range   Glucose-Capillary 111 (H) 65 - 99 mg/dL  HIV antibody     Status: None   Collection Time: 07/29/16 12:24 PM  Result Value Ref Range   HIV Screen 4th Generation wRfx Non Reactive Non Reactive    Comment: (NOTE) Performed At: [facility] [place], [place] [id number] Lindon Romp MD EI:[id number]   Legionella Pneumophila Serogp 1 Ur Ag     Status: None   Collection Time: 07/29/16  4:23 PM  Result Value Ref Range   L. pneumophila Serogp 1 Ur Ag Negative Negative    Comment: (NOTE) Presumptive negative for L. pneumophila serogroup 1 antigen in urine, suggesting no recent or current infection. Legionnaires' disease cannot be ruled out since other serogroups and species may also cause disease. Performed At: [facility] [place], [place] [id number] Lindon Romp MD [id number]   Culture, expectorated sputum-assessment     Status: None   Collection Time: 07/29/16  4:36 PM  Result Value Ref Range   Specimen Description SPUTUM    Special Requests NONE    Sputum evaluation      MICROSCOPIC FINDINGS SUGGEST THAT THIS SPECIMEN IS NOT REPRESENTATIVE OF LOWER RESPIRATORY SECRETIONS. PLEASE RECOLLECT. Gram Stain Report  Called to,Read Back By and Verified With: A MINTZ,RN AT 0149 07/29/16 BY L BENFIELD    Report Status 07/29/2016 FINAL   Glucose, capillary     Status: None   Collection Time: 07/30/16  7:28 AM  Result Value Ref Range   Glucose-Capillary 87 65 - 99 mg/dL  Cryptococcal antigen     Status: None  Collection Time: 07/30/16  3:05 PM  Result Value Ref Range   Crypto Ag NEGATIVE NEGATIVE   Cryptococcal Ag Titer NOT INDICATED NOT INDICATED   '@BRIEFLABTABLE'$ (sdes,specrequest,cult,reptstatus)   ) Recent Results (from the past 720 hour(s))  Culture, blood (Routine X 2)     Status: None (Preliminary result)   Collection Time: 07/29/16 12:21 AM  Result Value Ref Range Status   Specimen Description BLOOD LEFT ARM  Final   Special Requests BOTTLES DRAWN AEROBIC AND ANAEROBIC 6CC   Final   Culture NO GROWTH 1 DAY  Final   Report Status PENDING  Incomplete  Culture, blood (Routine X 2)     Status: None (Preliminary result)   Collection Time: 07/29/16 12:34 AM  Result Value Ref Range Status   Specimen Description BLOOD LEFT ARM  Final   Special Requests IN PEDIATRIC BOTTLE 4CC  Final   Culture NO GROWTH 1 DAY  Final   Report Status PENDING  Incomplete  Urine culture     Status: None   Collection Time: 07/29/16 12:45 AM  Result Value Ref Range Status   Specimen Description URINE, RANDOM  Final   Special Requests NONE  Final   Culture NO GROWTH  Final   Report Status 07/30/2016 FINAL  Final  Respiratory Panel by PCR     Status: None   Collection Time: 07/29/16  5:48 AM  Result Value Ref Range Status   Adenovirus NOT DETECTED NOT DETECTED Final   Coronavirus 229E NOT DETECTED NOT DETECTED Final   Coronavirus HKU1 NOT DETECTED NOT DETECTED Final   Coronavirus NL63 NOT DETECTED NOT DETECTED Final   Coronavirus OC43 NOT DETECTED NOT DETECTED Final   Metapneumovirus NOT DETECTED NOT DETECTED Final   Rhinovirus / Enterovirus NOT DETECTED NOT DETECTED Final   Influenza A NOT DETECTED NOT DETECTED  Final   Influenza A H1 NOT DETECTED NOT DETECTED Final   Influenza A H1 2009 NOT DETECTED NOT DETECTED Final   Influenza A H3 NOT DETECTED NOT DETECTED Final   Influenza B NOT DETECTED NOT DETECTED Final   Parainfluenza Virus 1 NOT DETECTED NOT DETECTED Final   Parainfluenza Virus 2 NOT DETECTED NOT DETECTED Final   Parainfluenza Virus 3 NOT DETECTED NOT DETECTED Final   Parainfluenza Virus 4 NOT DETECTED NOT DETECTED Final   Respiratory Syncytial Virus NOT DETECTED NOT DETECTED Final   Bordetella pertussis NOT DETECTED NOT DETECTED Final   Chlamydophila pneumoniae NOT DETECTED NOT DETECTED Final   Mycoplasma pneumoniae NOT DETECTED NOT DETECTED Final  Culture, expectorated sputum-assessment     Status: None   Collection Time: 07/29/16  4:36 PM  Result Value Ref Range Status   Specimen Description SPUTUM  Final   Special Requests NONE  Final   Sputum evaluation   Final    MICROSCOPIC FINDINGS SUGGEST THAT THIS SPECIMEN IS NOT REPRESENTATIVE OF LOWER RESPIRATORY SECRETIONS. PLEASE RECOLLECT. Gram Stain Report Called to,Read Back By and Verified With: A MINTZ,RN AT 1718 07/29/16 BY L BENFIELD    Report Status 07/29/2016 FINAL  Final     Impression/Recommendation  Principal Problem:   Sepsis, unspecified organism ([place]) Active Problems:   Hypokalemia   Sore throat   Sepsis ([place])   Pyrexia   Wayne Keller is a 30 y.o. male with admission with high fever, sore throat and cough with labs that were much more worrisome than his actual clinical presentation esp his sepsis markers that were sent such as lactic acid and PCT  #1 FUO:  Differential here includes atypical infection due to mycoplasma etc, even viral despite the negative resp panel.   It is possible that he has an occult bactermia that has not yet declared itself but this seems unlikely  He has had EXTENSIVE workup and only target are the lung findings  I will order some other FUO labs including auto-immune labs in  case this is the declaration of auto-immune disease with ANA, RF, ESR, CRP. Ferritin. LDH  I will check hepatitis panel and EBV and CMV though his picture and imaging do not fit with these  I think if he continues to feel well tomorrow he should be dc to complete z pack and can followup with his PCP and if still having fevers with me or one of my colleagues as an outpatient.     07/30/2016, 9:15 PM   Thank you so much for this interesting consult  [place] for [place] [phone] (pager) [phone] (office) 07/30/2016, 9:15 PM  Rhina [place] 07/30/2016, 9:15 PM

## 2016-07-30 NOTE — Progress Notes (Addendum)
Patient ID: Wayne Keller, male   DOB: 02/16/1986, 30 y.o.   MRN: [id number]  PROGRESS NOTE    Wayne Keller  LPF:[id number] DOB: 06/01/1986 DOA: 07/29/2016  PCP: No PCP Per Patient   Brief Narrative:  30 year old male with no significant past medical history. He returned from visiting family in [place] on 07/25/2016. Shortly thereafter he developed fever, sore throat and nonproductive cough of one day duration. He reported receiving typhoid vaccination as a child and again a couple years ago. He did not use malaria prophylaxis on his recent trip to [place]. No reprots of rash. No headaches. No mental status changes.   In ED, T max was 103.1 F, HR 57, RR 18-33. Blood cultures obtained, peripheral smear sent.   Assessment & Plan:  Fever with relative bradycardia / Sepsis, unspecified organism / cough / Leukocytosis - Sepsis criteria met on the admission. Patient on broad-spectrum antibiotics vanco and zosyn  - Pt had fever 8/6 103.1 and bradycardia of 57, concerning for possible legionella, typhoid. He did not get malaria or typhoid vaccination prior to travel to [place] - Legionella panel is pending, respiratory panel normal - CT chest without contrast showed possible atypical infection, MAI - Sputum culture collected but not a good representative sample. Patient may need different way to collect sputum sample but will follow-up with infectious disease and their recommendation. Pt may be required to have 2 sputum cultures - Blood cultures negative so far    DVT prophylaxis: Lovenox subcutaneous Code Status: full code  Family Communication: No family at the bedside Disposition Plan: home in next 24-48 hours    Consultants:   ID  Procedures:   None   Antimicrobials:   Vanco and zosyn 07/29/2016 -->   Subjective: No overnight events.   Objective: Vitals:   07/29/16 1745 07/29/16 2155 07/30/16 0426 07/30/16 0900  BP: (!) 113/47 (!) 111/54 (!) 105/54 (!) 110/56  Pulse:  79 73 76 77  Resp: _0 Temp: 99 F (37.2 C) 99.5 F (37.5 C) 98.6 F (37 C) 98.2 F (36.8 C)  TempSrc: Oral Oral Oral Oral  SpO2: 100% 99% 99% 99%  Weight:  80.7 kg (178 lb)    Height:  _1  (1.727 m)      Intake/Output Summary (Last 24 hours) at 07/30/16 1016 Last data filed at 07/30/16 0954  Gross per 24 hour  Intake              533 ml  Output                0 ml  Net              533 ml   Filed Weights   07/29/16 0111 07/29/16 0544 07/29/16 2155  Weight: 78 kg (171 lb 15.3 oz) 81 kg (178 lb 9.2 oz) 80.7 kg (178 lb)    Examination:  General exam: Appears calm and comfortable  Respiratory system: Clear to auscultation. Respiratory effort normal. Cardiovascular system: S1 & S2 heard, RRR. No JVD Gastrointestinal system: Abdomen is nondistended, soft and nontender. No organomegaly or masses felt. Normal bowel sounds heard. Central nervous system: Alert and oriented. No focal neurological deficits. Extremities: Symmetric 5 x 5 power. Skin: No rashes, lesions or ulcers Psychiatry: Judgement and insight appear normal. Mood & affect appropriate.   Data Reviewed: I have personally reviewed following labs and imaging studies  CBC:  Recent Labs Lab 07/29/16 0029  WBC 15.0*  NEUTROABS 12.0*  HGB 13.4  HCT 38.3*  MCV 82.5  PLT 030   Basic Metabolic Panel:  Recent Labs Lab 07/29/16 0028 07/29/16 0450  NA 137  --   K 3.4*  --   CL 102  --   CO2 25  --   GLUCOSE 133*  --   BUN 9  --   CREATININE 0.84  --   CALCIUM 8.6*  --   MG  --  1.8   GFR: Estimated Creatinine Clearance: 123.3 mL/min (by C-G formula based on SCr of 0.84 mg/dL). Liver Function Tests:  Recent Labs Lab 07/29/16 0028  AST 28  ALT 22  ALKPHOS 73  BILITOT 0.4  PROT 7.1  ALBUMIN 3.9   No results for input(s): LIPASE, AMYLASE in the last 168 hours. No results for input(s): AMMONIA in the last 168 hours. Coagulation Profile:  Recent Labs Lab 07/29/16 0155  INR 1.06    Cardiac Enzymes: No results for input(s): CKTOTAL, CKMB, CKMBINDEX, TROPONINI in the last 168 hours. BNP (last 3 results) No results for input(s): PROBNP in the last 8760 hours. HbA1C: No results for input(s): HGBA1C in the last 72 hours. CBG:  Recent Labs Lab 07/29/16 0729 07/30/16 0728  GLUCAP 111* 87   Lipid Profile: No results for input(s): CHOL, HDL, LDLCALC, TRIG, CHOLHDL, LDLDIRECT in the last 72 hours. Thyroid Function Tests: No results for input(s): TSH, T4TOTAL, FREET4, T3FREE, THYROIDAB in the last 72 hours. Anemia Panel: No results for input(s): VITAMINB12, FOLATE, FERRITIN, TIBC, IRON, RETICCTPCT in the last 72 hours. Urine analysis:    Component Value Date/Time   COLORURINE YELLOW 07/29/2016 0044   APPEARANCEUR CLOUDY (A) 07/29/2016 0044   LABSPEC 1.021 07/29/2016 0044   PHURINE 6.0 07/29/2016 0044   GLUCOSEU NEGATIVE 07/29/2016 0044   HGBUR NEGATIVE 07/29/2016 0044   BILIRUBINUR NEGATIVE 07/29/2016 0044   KETONESUR NEGATIVE 07/29/2016 0044   PROTEINUR 30 (A) 07/29/2016 0044   NITRITE NEGATIVE 07/29/2016 0044   LEUKOCYTESUR NEGATIVE 07/29/2016 0044   Sepsis Labs: _0 (procalcitonin:4,lacticidven:4)   Culture, blood (Routine X 2)     Status: None (Preliminary result)   Collection Time: 07/29/16 12:21 AM  Result Value Ref Range Status   Specimen Description BLOOD LEFT ARM  Final   Special Requests BOTTLES DRAWN AEROBIC AND ANAEROBIC 6CC   Final   Culture NO GROWTH < 24 HOURS  Final   Report Status PENDING  Incomplete  Culture, blood (Routine X 2)     Status: None (Preliminary result)   Collection Time: 07/29/16 12:34 AM  Result Value Ref Range Status   Specimen Description BLOOD LEFT ARM  Final   Special Requests IN PEDIATRIC BOTTLE 4CC  Final   Culture NO GROWTH < 24 HOURS  Final   Report Status PENDING  Incomplete  Respiratory Panel by PCR     Status: None   Collection Time: 07/29/16  5:48 AM  Result Value Ref Range Status   Adenovirus  NOT DETECTED NOT DETECTED Final   Coronavirus 229E NOT DETECTED NOT DETECTED Final   Coronavirus HKU1 NOT DETECTED NOT DETECTED Final   Coronavirus NL63 NOT DETECTED NOT DETECTED Final   Coronavirus OC43 NOT DETECTED NOT DETECTED Final   Metapneumovirus NOT DETECTED NOT DETECTED Final   Rhinovirus / Enterovirus NOT DETECTED NOT DETECTED Final   Influenza A NOT DETECTED NOT DETECTED Final   Influenza A H1 NOT DETECTED NOT DETECTED Final   Influenza A H1 2009 NOT DETECTED NOT DETECTED Final   Influenza A H3 NOT  DETECTED NOT DETECTED Final   Influenza B NOT DETECTED NOT DETECTED Final   Parainfluenza Virus 1 NOT DETECTED NOT DETECTED Final   Parainfluenza Virus 2 NOT DETECTED NOT DETECTED Final   Parainfluenza Virus 3 NOT DETECTED NOT DETECTED Final   Parainfluenza Virus 4 NOT DETECTED NOT DETECTED Final   Respiratory Syncytial Virus NOT DETECTED NOT DETECTED Final   Bordetella pertussis NOT DETECTED NOT DETECTED Final   Chlamydophila pneumoniae NOT DETECTED NOT DETECTED Final   Mycoplasma pneumoniae NOT DETECTED NOT DETECTED Final  Culture, expectorated sputum-assessment     Status: None   Collection Time: 07/29/16  4:36 PM  Result Value Ref Range Status   Specimen Description SPUTUM  Final   Special Requests NONE  Final   Sputum evaluation   Final    MICROSCOPIC FINDINGS SUGGEST THAT THIS SPECIMEN IS NOT REPRESENTATIVE OF LOWER RESPIRATORY SECRETIONS. PLEASE RECOLLECT. Gram Stain Report Called to,Read Back By and Verified With: A MINTZ,RN AT 2330 07/29/16 BY L BENFIELD    Report Status 07/29/2016 FINAL  Final      Radiology Studies: Dg Chest 2 View Result Date: 07/29/2016 CLINICAL DATA: No acute cardiopulmonary process. Electronically Signed   By: Elon Alas M.D.   On: 07/29/2016 00:59   Ct Chest Wo Contrast Result Date: 07/29/2016 CLINICAL DATA:  Patchy tree-in-bud infiltrates are noted bilaterally as described. This is likely related to a more atypical infection such as MAI  or possible fungal infection Electronically Signed   By: Inez Catalina M.D.   On: 07/29/2016 19:48      Scheduled Meds: . enoxaparin (LOVENOX) injection  40 mg Subcutaneous Daily  . piperacillin-tazobactam (ZOSYN)  IV  3.375 g Intravenous Q8H  . sodium chloride flush  3 mL Intravenous Q12H  . vancomycin  1,250 mg Intravenous Q12H   Continuous Infusions:    LOS: 1 day    Time spent: 25 minutes  Greater than 50% of the time spent on counseling and coordinating the care.   Leisa Lenz, MD [facility] Pager [phone]  If 7PM-7AM, please contact night-coverage [email or web address] Password [email or web address] 07/30/2016, 10:16 AM

## 2016-07-31 DIAGNOSIS — E876 Hypokalemia: Secondary | ICD-10-CM

## 2016-07-31 DIAGNOSIS — A419 Sepsis, unspecified organism: Secondary | ICD-10-CM | POA: Diagnosis not present

## 2016-07-31 DIAGNOSIS — J029 Acute pharyngitis, unspecified: Secondary | ICD-10-CM | POA: Diagnosis not present

## 2016-07-31 DIAGNOSIS — R509 Fever, unspecified: Secondary | ICD-10-CM | POA: Diagnosis not present

## 2016-07-31 LAB — SEDIMENTATION RATE: Sed Rate: 35 mm/hr — ABNORMAL HIGH (ref 0–16)

## 2016-07-31 LAB — FERRITIN: FERRITIN: 40 ng/mL (ref 24–336)

## 2016-07-31 LAB — C-REACTIVE PROTEIN: CRP: 7.5 mg/dL — ABNORMAL HIGH (ref <1.0)

## 2016-07-31 MED ORDER — AZITHROMYCIN 250 MG PO TABS: 250.0000 mg | ORAL_TABLET | Freq: Every day | ORAL | 0 refills | Status: AC

## 2016-07-31 NOTE — Discharge Instructions (Signed)
Azithromycin tablets °What is this medicine? °AZITHROMYCIN (az ith roe MYE sin) is a macrolide antibiotic. It is used to treat or prevent certain kinds of bacterial infections. It will not work for colds, flu, or other viral infections. °This medicine may be used for other purposes; ask your health care provider or pharmacist if you have questions. °What should I tell my health care provider before I take this medicine? °They need to know if you have any of these conditions: °-kidney disease °-liver disease °-irregular heartbeat or heart disease °-an unusual or allergic reaction to azithromycin, erythromycin, other macrolide antibiotics, foods, dyes, or preservatives °-pregnant or trying to get pregnant °-breast-feeding °How should I use this medicine? °Take this medicine by mouth with a full glass of water. Follow the directions on the prescription label. The tablets can be taken with food or on an empty stomach. If the medicine upsets your stomach, take it with food. Take your medicine at regular intervals. Do not take your medicine more often than directed. Take all of your medicine as directed even if you think your are better. Do not skip doses or stop your medicine early. Talk to your pediatrician regarding the use of this medicine in children. Special care may be needed. °Overdosage: If you think you have taken too much of this medicine contact a poison control center or emergency room at once. °NOTE: This medicine is only for you. Do not share this medicine with others. °What if I miss a dose? °If you miss a dose, take it as soon as you can. If it is almost time for your next dose, take only that dose. Do not take double or extra doses. °What may interact with this medicine? °Do not take this medicine with any of the following medications: °-lincomycin °This medicine may also interact with the following medications: °-amiodarone °-antacids °-birth control  pills °-cyclosporine °-digoxin °-magnesium °-nelfinavir °-phenytoin °-warfarin °This list may not describe all possible interactions. Give your health care provider a list of all the medicines, herbs, non-prescription drugs, or dietary supplements you use. Also tell them if you smoke, drink alcohol, or use illegal drugs. Some items may interact with your medicine. °What should I watch for while using this medicine? °Tell your doctor or health care professional if your symptoms do not improve. °Do not treat diarrhea with over the counter products. Contact your doctor if you have diarrhea that lasts more than 2 days or if it is severe and watery. °This medicine can make you more sensitive to the sun. Keep out of the sun. If you cannot avoid being in the sun, wear protective clothing and use sunscreen. Do not use sun lamps or tanning beds/booths. °What side effects may I notice from receiving this medicine? °Side effects that you should report to your doctor or health care professional as soon as possible: °-allergic reactions like skin rash, itching or hives, swelling of the face, lips, or tongue °-confusion, nightmares or hallucinations °-dark urine °-difficulty breathing °-hearing loss °-irregular heartbeat or chest pain °-pain or difficulty passing urine °-redness, blistering, peeling or loosening of the skin, including inside the mouth °-white patches or sores in the mouth °-yellowing of the eyes or skin °Side effects that usually do not require medical attention (report to your doctor or health care professional if they continue or are bothersome): °-diarrhea °-dizziness, drowsiness °-headache °-stomach upset or vomiting °-tooth discoloration °-vaginal irritation °This list may not describe all possible side effects. Call your doctor for medical advice about side   effects. You may report side effects to FDA at 1-800-FDA-1088. °Where should I keep my medicine? °Keep out of the reach of children. °Store at room  temperature between 15 and 30 degrees C (59 and 86 degrees F). Throw away any unused medicine after the expiration date. °NOTE: This sheet is a summary. It may not cover all possible information. If you have questions about this medicine, talk to your doctor, pharmacist, or health care provider. °  °© 2016, [employer]. (2013-07-16 15:38:48) ° °

## 2016-07-31 NOTE — Progress Notes (Signed)
Subjective: Patient feels great and would like to go home   Antibiotics:  Anti-infectives    Start     Dose/Rate Route Frequency Ordered Stop   07/31/16 1000  azithromycin (ZITHROMAX) tablet 250 mg     250 mg Oral Daily 07/30/16 1304 08/04/16 0959   07/31/16 0000  azithromycin (ZITHROMAX) 250 MG tablet     250 mg Oral Daily 07/31/16 1259     07/30/16 1315  azithromycin (ZITHROMAX) powder 1 g  Status:  Discontinued     1 g Oral  Once 07/30/16 1304 07/30/16 1304   07/30/16 1315  azithromycin (ZITHROMAX) tablet 500 mg     500 mg Oral Daily 07/30/16 1304 07/30/16 1320   07/29/16 1000  piperacillin-tazobactam (ZOSYN) IVPB 3.375 g  Status:  Discontinued     3.375 g 12.5 mL/hr over 240 Minutes Intravenous Every 8 hours 07/29/16 0450 07/30/16 1042   07/29/16 0800  vancomycin (VANCOCIN) 1,250 mg in sodium chloride 0.9 % 250 mL IVPB  Status:  Discontinued     1,250 mg 166.7 mL/hr over 90 Minutes Intravenous Every 12 hours 07/29/16 0450 07/30/16 1042   07/29/16 0445  piperacillin-tazobactam (ZOSYN) IVPB 3.375 g  Status:  Discontinued     3.375 g 100 mL/hr over 30 Minutes Intravenous  Once 07/29/16 0430 07/29/16 0449   07/29/16 0445  vancomycin (VANCOCIN) IVPB 1000 mg/200 mL premix  Status:  Discontinued     1,000 mg 200 mL/hr over 60 Minutes Intravenous  Once 07/29/16 0430 07/29/16 0449   07/29/16 0230  doxycycline (VIBRAMYCIN) 100 mg in dextrose 5 % 250 mL IVPB     100 mg 125 mL/hr over 120 Minutes Intravenous  Once 07/29/16 0215 07/29/16 0528   07/29/16 0145  piperacillin-tazobactam (ZOSYN) IVPB 3.375 g     3.375 g 100 mL/hr over 30 Minutes Intravenous  Once 07/29/16 0138 07/29/16 0318   07/29/16 0145  vancomycin (VANCOCIN) IVPB 1000 mg/200 mL premix     1,000 mg 200 mL/hr over 60 Minutes Intravenous  Once 07/29/16 0138 07/29/16 0335      Medications: Scheduled Meds: . azithromycin  250 mg Oral Daily  . enoxaparin (LOVENOX) injection  40 mg Subcutaneous Daily  . sodium  chloride flush  3 mL Intravenous Q12H   Continuous Infusions:  PRN Meds:.acetaminophen **OR** acetaminophen, bisacodyl, HYDROcodone-acetaminophen, ketorolac, ondansetron **OR** ondansetron (ZOFRAN) IV, polyethylene glycol    Objective: Weight change: -2 lb (-0.907 kg)  Intake/Output Summary (Last 24 hours) at 07/31/16 1750 Last data filed at 07/31/16 1300  Gross per 24 hour  Intake              960 ml  Output                0 ml  Net              960 ml   Blood pressure 124/66, pulse 78, temperature 98 F (36.7 C), temperature source Oral, resp. rate 18, height 5\' 8"  (1.727 m), weight 176 lb (79.8 kg), SpO2 100 %. Temp:  [97.9 F (36.6 C)-98 F (36.7 C)] 98 F (36.7 C) (08/08 1000) Pulse Rate:  [59-83] 78 (08/08 1000) Resp:  [16-18] 18 (08/08 1000) BP: (101-124)/(51-84) 124/66 (08/08 1000) SpO2:  [99 %-100 %] 100 % (08/08 1000) Weight:  [176 lb (79.8 kg)] 176 lb (79.8 kg) (08/07 2108)  Physical Exam: General: Alert and awake, oriented x3, not in any acute distress. HEENT: anicteric sclera, pupils reactive  to light and accommodation, EOMI CVS regular rate, normal r,  no murmur rubs or gallops Chest: clear to auscultation bilaterally, no wheezing, rales or rhonchi Abdomen: soft nontender, nondistended, normal bowel sounds, Extremities: no  clubbing or edema noted bilaterally Skin: no rashes Lymph: no new lymphadenopathy Neuro: nonfocal  CBC:    BMET  Recent Labs  07/29/16 0028  NA 137  K 3.4*  CL 102  CO2 25  GLUCOSE 133*  BUN 9  CREATININE 0.84  CALCIUM 8.6*     Liver Panel   Recent Labs  07/29/16 0028  PROT 7.1  ALBUMIN 3.9  AST 28  ALT 22  ALKPHOS 73  BILITOT 0.4       Sedimentation Rate  Recent Labs  07/31/16 0515  ESRSEDRATE 35*   C-Reactive Protein  Recent Labs  07/31/16 0515  CRP 7.5*    Micro Results: Recent Results (from the past 720 hour(s))  Culture, blood (Routine X 2)     Status: None (Preliminary result)    Collection Time: 07/29/16 12:21 AM  Result Value Ref Range Status   Specimen Description BLOOD LEFT ARM  Final   Special Requests BOTTLES DRAWN AEROBIC AND ANAEROBIC 6CC   Final   Culture NO GROWTH 2 DAYS  Final   Report Status PENDING  Incomplete  Culture, blood (Routine X 2)     Status: None (Preliminary result)   Collection Time: 07/29/16 12:34 AM  Result Value Ref Range Status   Specimen Description BLOOD LEFT ARM  Final   Special Requests IN PEDIATRIC BOTTLE 4CC  Final   Culture NO GROWTH 2 DAYS  Final   Report Status PENDING  Incomplete  Urine culture     Status: None   Collection Time: 07/29/16 12:45 AM  Result Value Ref Range Status   Specimen Description URINE, RANDOM  Final   Special Requests NONE  Final   Culture NO GROWTH  Final   Report Status 07/30/2016 FINAL  Final  Respiratory Panel by PCR     Status: None   Collection Time: 07/29/16  5:48 AM  Result Value Ref Range Status   Adenovirus NOT DETECTED NOT DETECTED Final   Coronavirus 229E NOT DETECTED NOT DETECTED Final   Coronavirus HKU1 NOT DETECTED NOT DETECTED Final   Coronavirus NL63 NOT DETECTED NOT DETECTED Final   Coronavirus OC43 NOT DETECTED NOT DETECTED Final   Metapneumovirus NOT DETECTED NOT DETECTED Final   Rhinovirus / Enterovirus NOT DETECTED NOT DETECTED Final   Influenza A NOT DETECTED NOT DETECTED Final   Influenza A H1 NOT DETECTED NOT DETECTED Final   Influenza A H1 2009 NOT DETECTED NOT DETECTED Final   Influenza A H3 NOT DETECTED NOT DETECTED Final   Influenza B NOT DETECTED NOT DETECTED Final   Parainfluenza Virus 1 NOT DETECTED NOT DETECTED Final   Parainfluenza Virus 2 NOT DETECTED NOT DETECTED Final   Parainfluenza Virus 3 NOT DETECTED NOT DETECTED Final   Parainfluenza Virus 4 NOT DETECTED NOT DETECTED Final   Respiratory Syncytial Virus NOT DETECTED NOT DETECTED Final   Bordetella pertussis NOT DETECTED NOT DETECTED Final   Chlamydophila pneumoniae NOT DETECTED NOT DETECTED Final    Mycoplasma pneumoniae NOT DETECTED NOT DETECTED Final  Culture, expectorated sputum-assessment     Status: None   Collection Time: 07/29/16  4:36 PM  Result Value Ref Range Status   Specimen Description SPUTUM  Final   Special Requests NONE  Final   Sputum evaluation   Final  MICROSCOPIC FINDINGS SUGGEST THAT THIS SPECIMEN IS NOT REPRESENTATIVE OF LOWER RESPIRATORY SECRETIONS. PLEASE RECOLLECT. Gram Stain Report Called to,Read Back By and Verified With: A MINTZ,RN AT 1718 07/29/16 BY L BENFIELD    Report Status 07/29/2016 FINAL  Final    Studies/Results: Ct Chest Wo Contrast  Result Date: 07/29/2016 CLINICAL DATA:  Worsening fevers EXAM: CT CHEST WITHOUT CONTRAST TECHNIQUE: Multidetector CT imaging of the chest was performed following the standard protocol without IV contrast. COMPARISON:  Plain film from earlier in the same day FINDINGS: Cardiovascular: Somewhat limited due to the lack of IV contrast. The thoracic aorta shows no aneurysmal dilatation. Mediastinum/Nodes: Thoracic inlet is within normal limits. No sizable mediastinal or hilar adenopathy is noted. Minimal residual thymus tissue is noted in the anterior mediastinum. Lungs/Pleura: Multifocal tree-in-bud infiltrates are seen within the left lower lobe and right upper lobe. No focal confluent infiltrate is noted. No sizable effusion is seen. No pneumothorax is noted. Upper Abdomen: Within normal limits. Musculoskeletal: Within normal limits. IMPRESSION: Patchy tree-in-bud infiltrates are noted bilaterally as described. This is likely related to a more atypical infection such as MAI or possible fungal infection Electronically Signed   By: Alcide Clever M.D.   On: 07/29/2016 19:48   Ct Abdomen Pelvis W Contrast  Result Date: 07/30/2016 CLINICAL DATA:  Unknown source of sepsis. EXAM: CT ABDOMEN AND PELVIS WITH CONTRAST TECHNIQUE: Multidetector CT imaging of the abdomen and pelvis was performed using the standard protocol following bolus  administration of intravenous contrast. CONTRAST:  ISOVUE-300 IOPAMIDOL (ISOVUE-300) INJECTION 61% COMPARISON:  Chest CT from yesterday. FINDINGS: Lower chest: The lung bases again demonstrate the patchy tree-in-bud appearance and small airspace nodules in the left lower lobe. No pleural effusion or focal airspace consolidation. The heart is normal in size. No pericardial effusion. The distal esophagus is grossly normal. Hepatobiliary: No focal hepatic lesions or intrahepatic biliary dilatation. The gallbladder is normal. No common bile duct dilatation. Pancreas: No mass, inflammation or ductal dilatation. Spleen: Normal size.  No focal lesions. Adrenals/Urinary Tract: The adrenal glands and kidneys are normal. No renal or obstructing ureteral calculi. No findings for pyelonephritis or hydronephrosis. The bladder is normal. Stomach/Bowel: The stomach, duodenum, small bowel and colon are unremarkable. No inflammatory changes, mass lesions or obstructive findings. The terminal ileum is normal. The appendix is normal. Vascular/Lymphatic: The aorta and branch vessels are normal. The major venous structures are patent. There are small scattered mesenteric and retroperitoneal lymph nodes. No mass or overt adenopathy. Other: No pelvic mass or adenopathy. No free pelvic fluid collections. No inguinal mass or adenopathy. Musculoskeletal: No significant bony findings. IMPRESSION: No acute abdominal/ pelvic findings, mass lesions are lymphadenopathy. Small scattered mesenteric and retroperitoneal lymph nodes are noted. Electronically Signed   By: Rudie Meyer M.D.   On: 07/30/2016 19:10      Assessment/Plan:  INTERVAL HISTORY: CT abdomen benign   Principal Problem:   Sepsis, unspecified organism (HCC) Active Problems:   Hypokalemia   Sore throat   Sepsis (HCC)   FUO (fever of unknown origin)   Atypical pneumonia    Wayne Keller is a 30 y.o. male with  Fever and ST, cough resolving on abx. He  likely simply had an atypical bacterial infection such as with mycoplasma vs viral infection  Fine to DC on zpack.   LOS: 1 day   Acey Lav 07/31/2016, 5:50 PM

## 2016-07-31 NOTE — Discharge Summary (Addendum)
Physician Discharge Summary  Wayne Keller IAX:[id number] DOB: 13-Feb-1986 DOA: 07/29/2016  PCP: No PCP Per Patient  Admit date: 07/29/2016 Discharge date: 07/31/2016  Recommendations for Outpatient Follow-up:  Continue azithromycin on discharge for 4 days Follow up with PCP results of Hepatitis panel, CMV, ANA, RF Information provided about [facility] to follow up in their clinic on discharge   Discharge Diagnoses:  Principal Problem:   Sepsis, unspecified organism ([place]) Active Problems:   Hypokalemia   Sore throat   Sepsis ([place])   FUO (fever of unknown origin)   Atypical pneumonia    Discharge Condition: stable   Diet recommendation: as tolerated   History of present illness:  30 year old male with nosignificant past medical history. He returned from visiting family in [place] on 07/25/2016. Shortly thereafter he developed fever, sore throat and nonproductivecough of one day duration. He reported receiving typhoid vaccination as a child and again a couple years ago. He did not use malaria prophylaxis on his recent trip to [place]. No reprots of rash. No headaches. No mental status changes.   In ED, T max was 103.1 F, HR 57, RR 18-33. Blood cultures obtained, peripheral smear sent.  Hospital Course:   Assessment & Plan:  Fever with relative bradycardia / Sepsis, unspecified organism / cough / Leukocytosis - Sepsis criteria met on the admission. Patient on broad-spectrum antibiotics vanco and zosyn through 8/8 and then changed to azithro per ID - Pt had fever 8/6 103.1 and bradycardia of 57 on admisison, concerning for possible legionella, typhoid. He did not get malaria or typhoid vaccination prior to travel to [place] - Legionella panel is negative, respiratory panel normal, HIV negative, sputum cx not a good specimen - Blood cultures negative so far - CT chest with tree in bud appearance, possible MAI or fungus or atypical infection, per ID okay to continue azithro for 4  days on discharge  - Inflammatory markers ESR and CRP up but ferritin WNL - CMV and RF pending, needs to be followed on outpt basis    DVT prophylaxis: Lovenox subcutaneous Code Status: full code  Family Communication: No family at the bedside Disposition Plan: home in next 24-48 hours    Consultants:   ID  Procedures:   None   Antimicrobials:   Vanco and zosyn 07/29/2016 --> 07/31/2016  Azithromycin 07/31/2016 --> for 4 days on discharge    Signed:  Leisa Lenz, MD  [facility] 07/31/2016, 1:00 PM  Pager #: [phone]  Time spent in minutes: less than 30 minutes    Discharge Exam: Vitals:   07/31/16 0520 07/31/16 1000  BP: 112/66 124/66  Pulse: 74 78  Resp: 16 18  Temp: 98 F (36.7 C) 98 F (36.7 C)   Vitals:   07/30/16 1823 07/30/16 2108 07/31/16 0520 07/31/16 1000  BP: 118/84 (!) 101/51 112/66 124/66  Pulse: 83 (!) 59 74 78  Resp: _0 Temp: 98 F (36.7 C) 97.9 F (36.6 C) 98 F (36.7 C) 98 F (36.7 C)  TempSrc: Oral Oral Oral Oral  SpO2: 100% 99% 100% 100%  Weight:  79.8 kg (176 lb)    Height:        General: Pt is alert, follows commands appropriately, not in acute distress Cardiovascular: Regular rate and rhythm, S1/S2 +, no murmurs Respiratory: Clear to auscultation bilaterally, no wheezing, no crackles, no rhonchi Abdominal: Soft, non tender, non distended, bowel sounds +, no guarding Extremities: no edema, no cyanosis, pulses palpable bilaterally DP  and PT Neuro: Grossly nonfocal  Discharge Instructions  Discharge Instructions    Call MD for:  persistant nausea and vomiting    Complete by:  As directed   Call MD for:  severe uncontrolled pain    Complete by:  As directed   Diet - low sodium heart healthy    Complete by:  As directed   Discharge instructions    Complete by:  As directed   Continue azithromycin on discharge for 4 days Follow up with PCP results of Hepatitis panel, CMV, ANA, RF   Increase activity  slowly    Complete by:  As directed       Medication List    TAKE these medications   azithromycin 250 MG tablet Commonly known as:  ZITHROMAX Take 1 tablet (250 mg total) by mouth daily.      Follow-up Information    [facility]. Schedule an appointment as soon as possible for a visit in 2 week(s).   Contact information: [address] [place] [address] [phone]           The results of significant diagnostics from this hospitalization (including imaging, microbiology, ancillary and laboratory) are listed below for reference.    Significant Diagnostic Studies: Dg Chest 2 View  Result Date: 07/29/2016 CLINICAL DATA:  Fever, congestion and cough for 2 days. EXAM: CHEST  2 VIEW COMPARISON:  None. FINDINGS: The heart size and mediastinal contours are within normal limits. Both lungs are clear. The visualized skeletal structures are unremarkable. IMPRESSION: No acute cardiopulmonary process. Electronically Signed   By: Elon Alas M.D.   On: 07/29/2016 00:59   Ct Chest Wo Contrast  Result Date: 07/29/2016 CLINICAL DATA:  Worsening fevers EXAM: CT CHEST WITHOUT CONTRAST TECHNIQUE: Multidetector CT imaging of the chest was performed following the standard protocol without IV contrast. COMPARISON:  Plain film from earlier in the same day FINDINGS: Cardiovascular: Somewhat limited due to the lack of IV contrast. The thoracic aorta shows no aneurysmal dilatation. Mediastinum/Nodes: Thoracic inlet is within normal limits. No sizable mediastinal or hilar adenopathy is noted. Minimal residual thymus tissue is noted in the anterior mediastinum. Lungs/Pleura: Multifocal tree-in-bud infiltrates are seen within the left lower lobe and right upper lobe. No focal confluent infiltrate is noted. No sizable effusion is seen. No pneumothorax is noted. Upper Abdomen: Within normal limits. Musculoskeletal: Within normal limits. IMPRESSION:  Patchy tree-in-bud infiltrates are noted bilaterally as described. This is likely related to a more atypical infection such as MAI or possible fungal infection Electronically Signed   By: Inez Catalina M.D.   On: 07/29/2016 19:48   Ct Abdomen Pelvis W Contrast  Result Date: 07/30/2016 CLINICAL DATA:  Unknown source of sepsis. EXAM: CT ABDOMEN AND PELVIS WITH CONTRAST TECHNIQUE: Multidetector CT imaging of the abdomen and pelvis was performed using the standard protocol following bolus administration of intravenous contrast. CONTRAST:  162m ISOVUE-300 IOPAMIDOL (ISOVUE-300) INJECTION 61% COMPARISON:  Chest CT from yesterday. FINDINGS: Lower chest: The lung bases again demonstrate the patchy tree-in-bud appearance and small airspace nodules in the left lower lobe. No pleural effusion or focal airspace consolidation. The heart is normal in size. No pericardial effusion. The distal esophagus is grossly normal. Hepatobiliary: No focal hepatic lesions or intrahepatic biliary dilatation. The gallbladder is normal. No common bile duct dilatation. Pancreas: No mass, inflammation or ductal dilatation. Spleen: Normal size.  No focal lesions. Adrenals/Urinary Tract: The adrenal glands and kidneys are normal. No renal  or obstructing ureteral calculi. No findings for pyelonephritis or hydronephrosis. The bladder is normal. Stomach/Bowel: The stomach, duodenum, small bowel and colon are unremarkable. No inflammatory changes, mass lesions or obstructive findings. The terminal ileum is normal. The appendix is normal. Vascular/Lymphatic: The aorta and branch vessels are normal. The major venous structures are patent. There are small scattered mesenteric and retroperitoneal lymph nodes. No mass or overt adenopathy. Other: No pelvic mass or adenopathy. No free pelvic fluid collections. No inguinal mass or adenopathy. Musculoskeletal: No significant bony findings. IMPRESSION: No acute abdominal/ pelvic findings, mass lesions are  lymphadenopathy. Small scattered mesenteric and retroperitoneal lymph nodes are noted. Electronically Signed   By: Marijo Sanes M.D.   On: 07/30/2016 19:10    Microbiology: Recent Results (from the past 240 hour(s))  Culture, blood (Routine X 2)     Status: None (Preliminary result)   Collection Time: 07/29/16 12:21 AM  Result Value Ref Range Status   Specimen Description BLOOD LEFT ARM  Final   Special Requests BOTTLES DRAWN AEROBIC AND ANAEROBIC 6CC   Final   Culture NO GROWTH 2 DAYS  Final   Report Status PENDING  Incomplete  Culture, blood (Routine X 2)     Status: None (Preliminary result)   Collection Time: 07/29/16 12:34 AM  Result Value Ref Range Status   Specimen Description BLOOD LEFT ARM  Final   Special Requests IN PEDIATRIC BOTTLE 4CC  Final   Culture NO GROWTH 2 DAYS  Final   Report Status PENDING  Incomplete  Urine culture     Status: None   Collection Time: 07/29/16 12:45 AM  Result Value Ref Range Status   Specimen Description URINE, RANDOM  Final   Special Requests NONE  Final   Culture NO GROWTH  Final   Report Status 07/30/2016 FINAL  Final  Respiratory Panel by PCR     Status: None   Collection Time: 07/29/16  5:48 AM  Result Value Ref Range Status   Adenovirus NOT DETECTED NOT DETECTED Final   Coronavirus 229E NOT DETECTED NOT DETECTED Final   Coronavirus HKU1 NOT DETECTED NOT DETECTED Final   Coronavirus NL63 NOT DETECTED NOT DETECTED Final   Coronavirus OC43 NOT DETECTED NOT DETECTED Final   Metapneumovirus NOT DETECTED NOT DETECTED Final   Rhinovirus / Enterovirus NOT DETECTED NOT DETECTED Final   Influenza A NOT DETECTED NOT DETECTED Final   Influenza A H1 NOT DETECTED NOT DETECTED Final   Influenza A H1 2009 NOT DETECTED NOT DETECTED Final   Influenza A H3 NOT DETECTED NOT DETECTED Final   Influenza B NOT DETECTED NOT DETECTED Final   Parainfluenza Virus 1 NOT DETECTED NOT DETECTED Final   Parainfluenza Virus 2 NOT DETECTED NOT DETECTED Final    Parainfluenza Virus 3 NOT DETECTED NOT DETECTED Final   Parainfluenza Virus 4 NOT DETECTED NOT DETECTED Final   Respiratory Syncytial Virus NOT DETECTED NOT DETECTED Final   Bordetella pertussis NOT DETECTED NOT DETECTED Final   Chlamydophila pneumoniae NOT DETECTED NOT DETECTED Final   Mycoplasma pneumoniae NOT DETECTED NOT DETECTED Final  Culture, expectorated sputum-assessment     Status: None   Collection Time: 07/29/16  4:36 PM  Result Value Ref Range Status   Specimen Description SPUTUM  Final   Special Requests NONE  Final   Sputum evaluation   Final    MICROSCOPIC FINDINGS SUGGEST THAT THIS SPECIMEN IS NOT REPRESENTATIVE OF LOWER RESPIRATORY SECRETIONS. PLEASE RECOLLECT. Gram Stain Report Called to,Read Back By and Verified  With: A MINTZ,RN AT 1718 07/29/16 BY L BENFIELD    Report Status 07/29/2016 FINAL  Final     Labs: Basic Metabolic Panel:  Recent Labs Lab 07/29/16 0028 07/29/16 0450  NA 137  --   K 3.4*  --   CL 102  --   CO2 25  --   GLUCOSE 133*  --   BUN 9  --   CREATININE 0.84  --   CALCIUM 8.6*  --   MG  --  1.8   Liver Function Tests:  Recent Labs Lab 07/29/16 0028  AST 28  ALT 22  ALKPHOS 73  BILITOT 0.4  PROT 7.1  ALBUMIN 3.9   No results for input(s): LIPASE, AMYLASE in the last 168 hours. No results for input(s): AMMONIA in the last 168 hours. CBC:  Recent Labs Lab 07/29/16 0029 07/30/16 2224  WBC 15.0* 7.7  NEUTROABS 12.0* 4.2  HGB 13.4 12.8*  HCT 38.3* 37.0*  MCV 82.5 83.0  PLT 267 245   Cardiac Enzymes: No results for input(s): CKTOTAL, CKMB, CKMBINDEX, TROPONINI in the last 168 hours. BNP: BNP (last 3 results) No results for input(s): BNP in the last 8760 hours.  ProBNP (last 3 results) No results for input(s): PROBNP in the last 8760 hours.  CBG:  Recent Labs Lab 07/29/16 0729 07/30/16 0728  GLUCAP 111* 87

## 2016-08-01 LAB — EPSTEIN-BARR VIRUS VCA ANTIBODY PANEL
EBV NA IgG: 598 U/mL — ABNORMAL HIGH (ref 0.0–17.9)
EBV VCA IGG: 158 U/mL — AB (ref 0.0–17.9)
EBV VCA IgM: <36 U/mL (ref 0.0–35.9)

## 2016-08-01 LAB — HEPATITIS PANEL, ACUTE
Hep A IgM: NEGATIVE
Hep B C IgM: NEGATIVE
Hepatitis B Surface Ag: NEGATIVE

## 2016-08-01 LAB — ANTIEXTRACTABLE NUCLEAR AG: ENA SM Ab Ser-aCnc: <0.2 AI (ref 0.0–0.9)

## 2016-08-01 LAB — HISTOPLASMA ANTIGEN, URINE: Histoplasma Antigen, urine: 0 ng/mL (ref 0.00–0.49)

## 2016-08-01 LAB — PARASITE EXAM, BLOOD

## 2016-08-01 LAB — CMV IGM

## 2016-08-01 LAB — RHEUMATOID FACTOR: RHEUMATOID FACTOR: 10.3 [IU]/mL (ref 0.0–13.9)

## 2016-08-01 LAB — CMV ANTIBODY, IGG (EIA): CMV AB - IGG: 7.3 U/mL — AB (ref 0.00–0.59)

## 2016-08-03 LAB — CULTURE, BLOOD (ROUTINE X 2)
Culture: NO GROWTH
Culture: NO GROWTH

## 2018-03-16 IMAGING — CT CT ABD-PELV W/ CM
2 of 4 series · 16 of 46 positions shown, 18 images · IV contrast (APPLIED)
Comparison: Chest CT from yesterday.

CLINICAL DATA: Unknown source of sepsis.

EXAM:
CT ABDOMEN AND PELVIS WITH CONTRAST
TECHNIQUE: Multidetector CT imaging of the abdomen and pelvis was performed
using the standard protocol following bolus administration of
intravenous contrast.
CONTRAST:  100mL NLFRF2-JAA IOPAMIDOL (NLFRF2-JAA) INJECTION 61%

[Series 2: abd/ pelvis 5.0 i30f 1 · axial · 0.71mm/px · z∈[+736,+1196]mm · 13 of 100 slices shown, 15 images]
[im 4/100  soft-tissue]
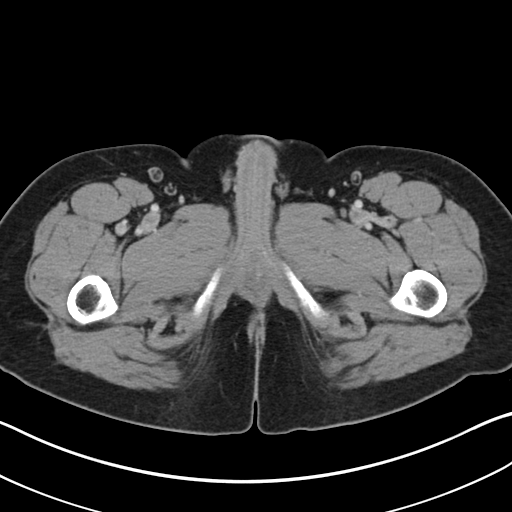
[im 4/100  bone]
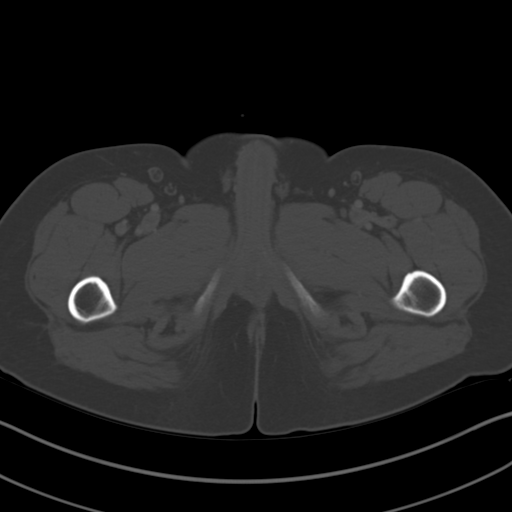
[im 12/100  soft-tissue]
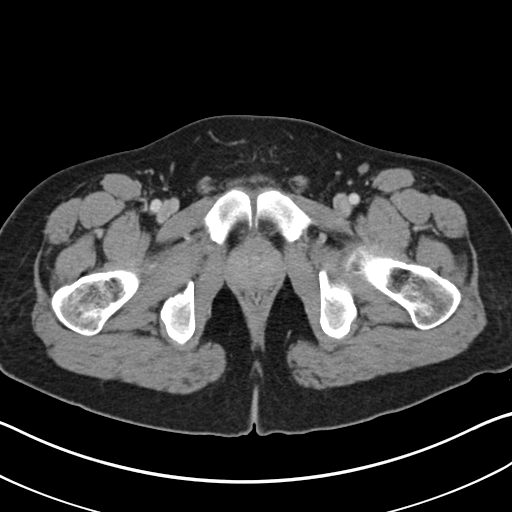
[im 20/100  soft-tissue]
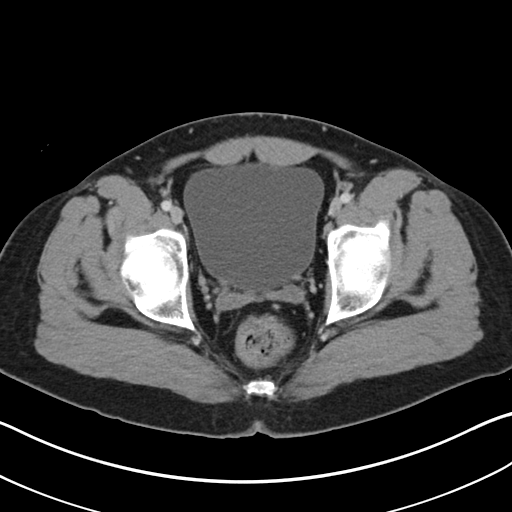
[im 28/100  soft-tissue]
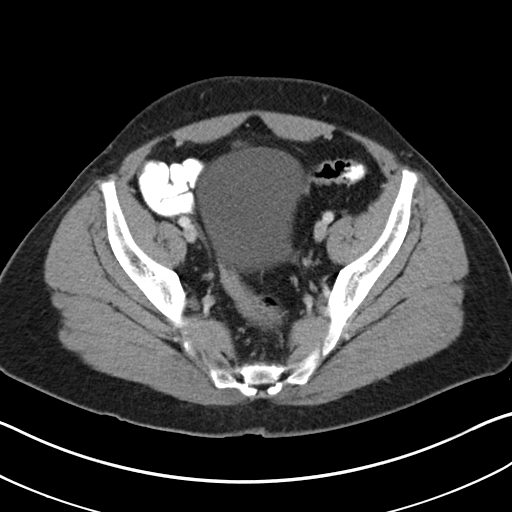
[im 36/100  soft-tissue]
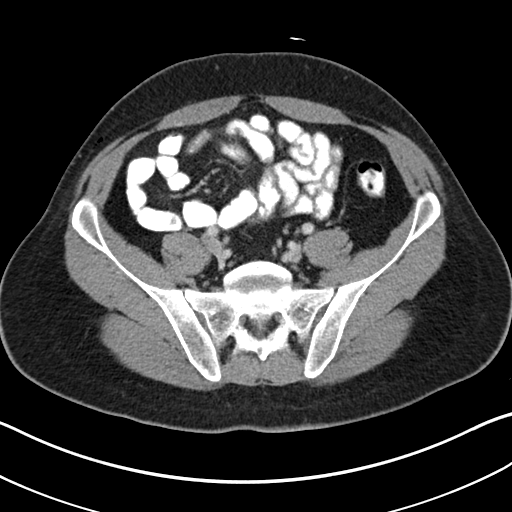
[im 44/100  soft-tissue]
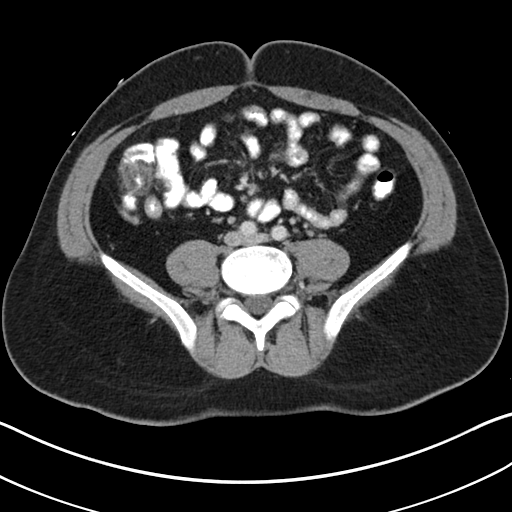
[im 52/100  soft-tissue]
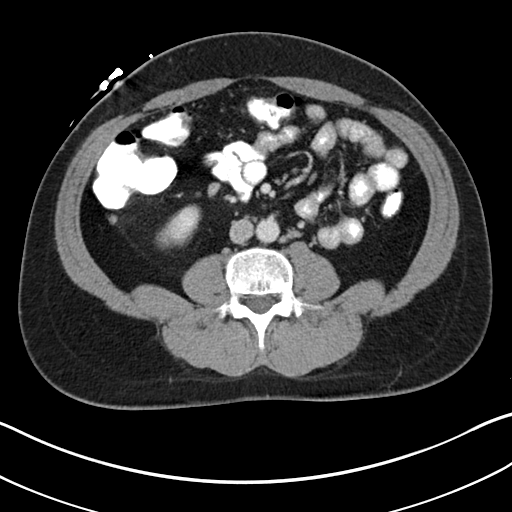
[im 56/100  soft-tissue]
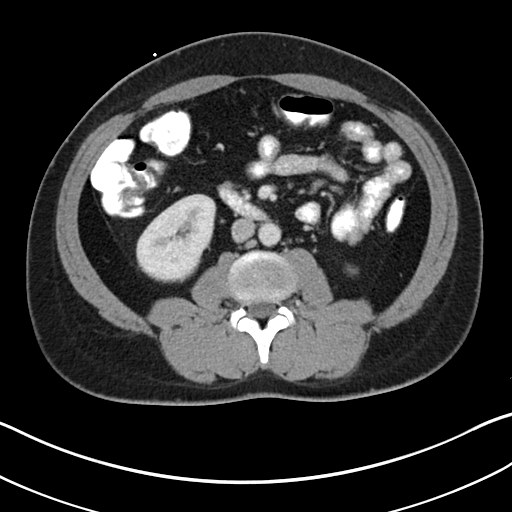
[im 64/100  soft-tissue]
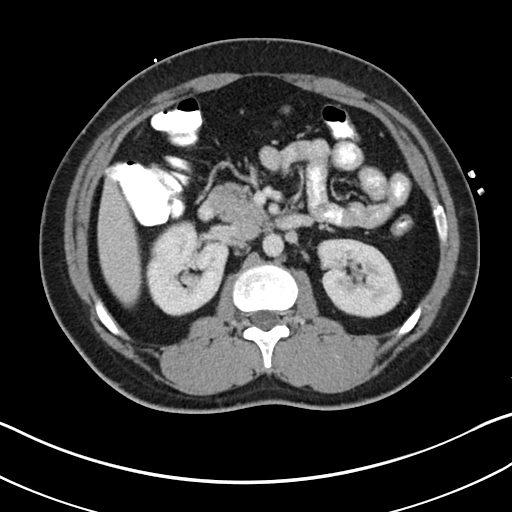
[im 64/100  bone]
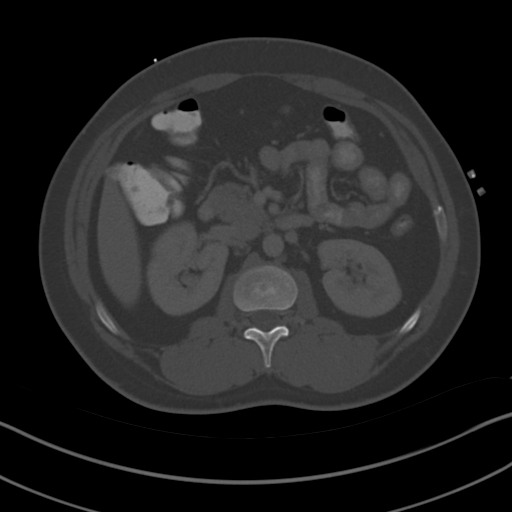
[im 72/100  soft-tissue]
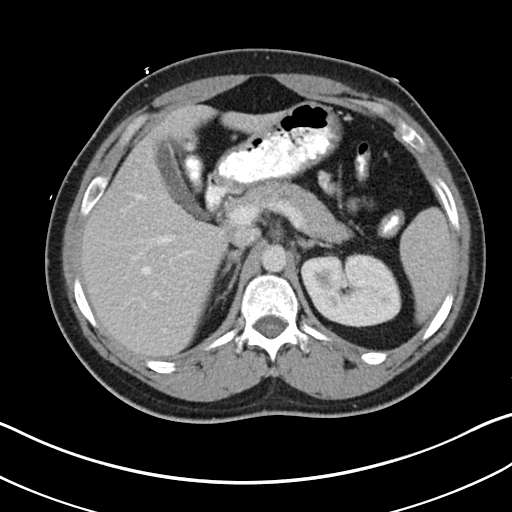
[im 80/100  soft-tissue]
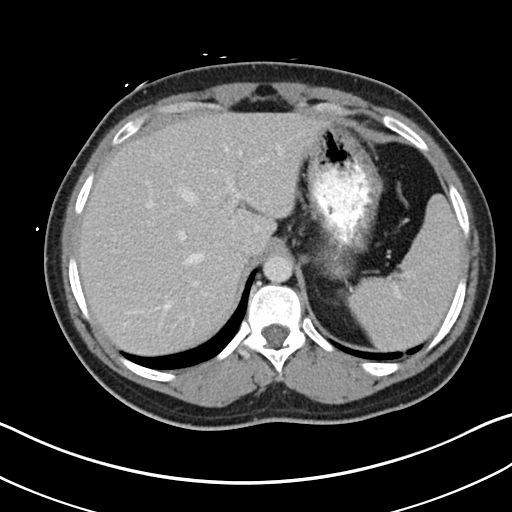
[im 88/100  soft-tissue]
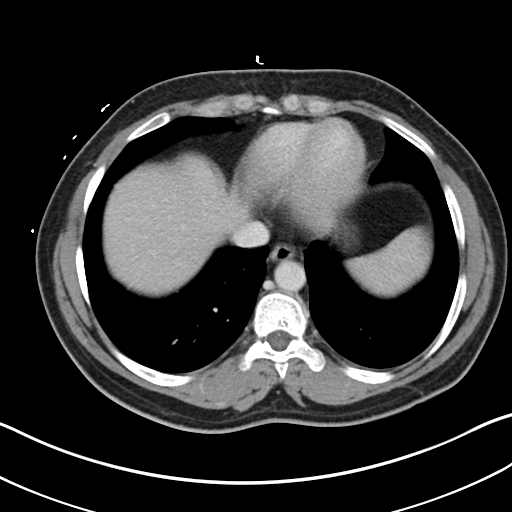
[im 96/100  soft-tissue]
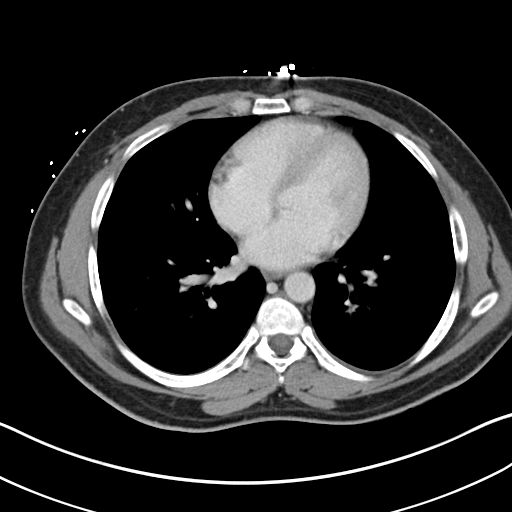

[Series 5: coronal soft tissue · coronal · 0.70mm/px · 3 of 83 slices shown]
[im 28/83  soft-tissue]
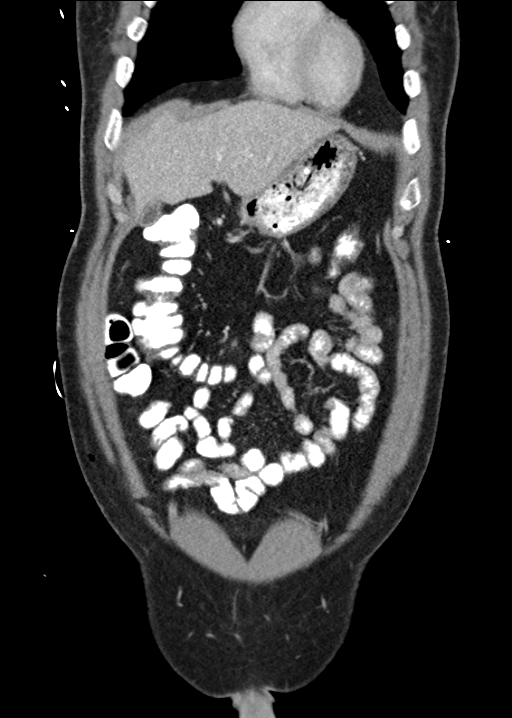
[im 37/83  soft-tissue]
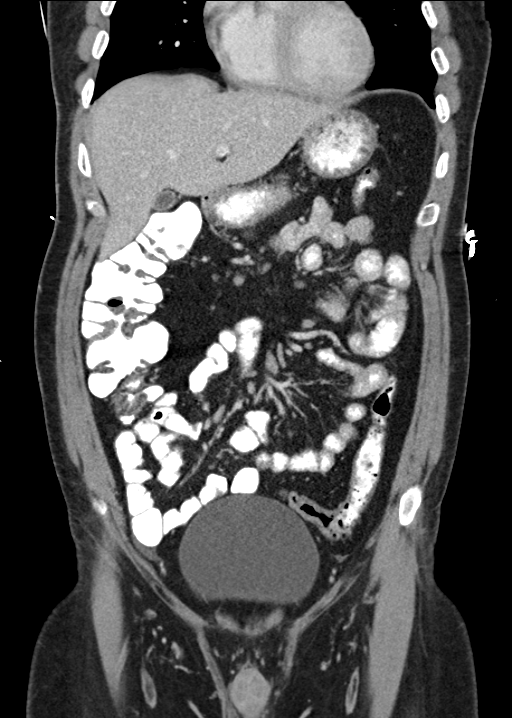
[im 46/83  soft-tissue]
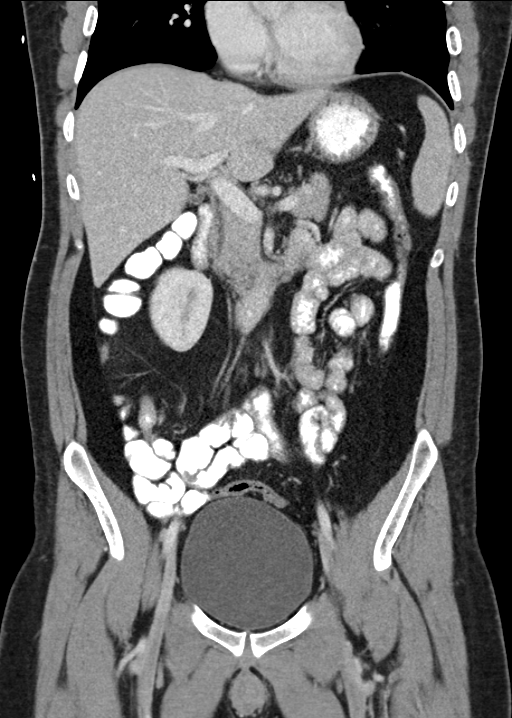

[16 of 46 positions shown; findings below may reference images not displayed]

FINDINGS: Lower chest: The lung bases again demonstrate the patchy tree-in-bud
appearance and small airspace nodules in the left lower lobe. No
pleural effusion or focal airspace consolidation. The heart is
normal in size. No pericardial effusion. The distal esophagus is
grossly normal.

Hepatobiliary: No focal hepatic lesions or intrahepatic biliary
dilatation. The gallbladder is normal. No common bile duct
dilatation.

Pancreas: No mass, inflammation or ductal dilatation.

Spleen: Normal size.  No focal lesions.

Adrenals/Urinary Tract: The adrenal glands and kidneys are normal.
No renal or obstructing ureteral calculi. No findings for
pyelonephritis or hydronephrosis. The bladder is normal.

Stomach/Bowel: The stomach, duodenum, small bowel and colon are
unremarkable. No inflammatory changes, mass lesions or obstructive
findings. The terminal ileum is normal. The appendix is normal.

Vascular/Lymphatic: The aorta and branch vessels are normal. The
major venous structures are patent. There are small scattered
mesenteric and retroperitoneal lymph nodes. No mass or overt
adenopathy.

Other: No pelvic mass or adenopathy. No free pelvic fluid
collections. No inguinal mass or adenopathy.

Musculoskeletal: No significant bony findings.
IMPRESSION: No acute abdominal/ pelvic findings, mass lesions are
lymphadenopathy. Small scattered mesenteric and retroperitoneal
lymph nodes are noted.

## 2020-08-01 ENCOUNTER — Ambulatory Visit: Payer: BLUE CROSS/BLUE SHIELD | Admitting: Neurology

## 2021-05-27 NOTE — Progress Notes (Signed)
Formatting of this note might be different from the original.    Self Swab Type: Anterior Nasal    Time of patient swab  Electronically signed by Deniece Ree at 05/27/2021  4:39 PM EDT

## 2021-10-31 NOTE — Telephone Encounter (Signed)
Formatting of this note might be different from the original.  Disability Form (Completed, Faxed and scanned)    ----- Message from Lorre Nick sent at 10/26/2021  8:50 AM EDT -----  Regarding: FMLA/SHORT-TERM DA  Pt paid & filled out forms for insurance for job today. 10/26/21      Electronically signed by Sela Hua at 10/31/2021  2:19 PM EST

## 2022-01-29 ENCOUNTER — Ambulatory Visit
Admission: EM | Admit: 2022-01-29 | Discharge: 2022-01-29 | Disposition: A | Discharge status: Home / Self Care | Payer: BC Managed Care – PPO | Attending: Emergency Medicine | Admitting: Emergency Medicine

## 2022-01-29 ENCOUNTER — Encounter: Payer: Self-pay | Admitting: Emergency Medicine

## 2022-01-29 DIAGNOSIS — M5431 Sciatica, right side: Secondary | ICD-10-CM

## 2022-01-29 MED ORDER — MELOXICAM 7.5 MG PO TABS: 7.5000 mg | ORAL_TABLET | Freq: Every day | ORAL | 0 refills | Status: AC

## 2022-01-29 MED ORDER — METHOCARBAMOL 500 MG PO TABS: 500.0000 mg | ORAL_TABLET | Freq: Two times a day (BID) | ORAL | 0 refills | Status: AC | PRN

## 2022-01-29 NOTE — Discharge Instructions (Addendum)
Take the meloxicam as directed.    Take the methocarbamol as needed for muscle spasm; Do not drive, operate machinery, or drink alcohol with this medication as it can cause drowsiness.   Follow up with your primary care provider or an orthopedist if your symptoms are not improving.

## 2022-01-29 NOTE — ED Provider Notes (Signed)
Wayne Keller    CSN: [id number] Arrival date & time: 01/29/22  1301      History   Chief Complaint Chief Complaint  Patient presents with   Hip Pain    HPI Wayne Keller is a 36 y.o. male.  Patient presents with right lower back pain that radiates down his right leg x 1 week.  No falls or injury.  The pain is worse with ambulation and improves with rest.  Treatment attempted at home with ibuprofen.  He denies numbness, weakness, saddle anesthesia, loss of bowel/bladder control, abdominal pain, dysuria, hematuria, fever, or other symptoms.  He denies history of similar symptoms.  His medical history includes left inguinal hernia repair 3 months ago.  The history is provided by the patient and medical records.   History reviewed. No pertinent past medical history.  Patient Active Problem List   Diagnosis Date Noted   Atypical pneumonia    Sepsis, unspecified organism ([place]) 07/29/2016   Hypokalemia 07/29/2016   Sore throat 07/29/2016   Sepsis ([place]) 07/29/2016   FUO (fever of unknown origin)     Past Surgical History:  Procedure Laterality Date   HERNIA REPAIR         Home Medications    Prior to Admission medications   Medication Sig Start Date End Date Taking? Authorizing Provider  meloxicam (MOBIC) 7.5 MG tablet Take 1 tablet (7.5 mg total) by mouth daily for 7 days. 01/29/22 02/05/22 Yes Sharion Balloon, NP  methocarbamol (ROBAXIN) 500 MG tablet Take 1 tablet (500 mg total) by mouth 2 (two) times daily as needed for muscle spasms. 01/29/22  Yes Sharion Balloon, NP  azithromycin (ZITHROMAX) 250 MG tablet Take 1 tablet (250 mg total) by mouth daily. 07/31/16   Robbie Lis, MD    Family History Family History  Problem Relation Age of Onset   Hypertension Mother     Social History Social History   Tobacco Use   Smoking status: Never   Smokeless tobacco: Never  Vaping Use   Vaping Use: Never used  Substance Use Topics   Alcohol use: No    Alcohol/week:  0.0 standard drinks   Drug use: No     Allergies   Patient has no known allergies.   Review of Systems Review of Systems  Constitutional:  Negative for chills and fever.  Respiratory:  Negative for cough and shortness of breath.   Cardiovascular:  Negative for chest pain and palpitations.  Gastrointestinal:  Negative for abdominal pain, nausea and vomiting.  Genitourinary:  Negative for dysuria and hematuria.  Musculoskeletal:  Positive for back pain and gait problem. Negative for joint swelling.  Skin:  Negative for color change and rash.  Neurological:  Negative for weakness and numbness.  All other systems reviewed and are negative.   Physical Exam Triage Vital Signs ED Triage Vitals [01/29/22 1335]  Enc Vitals Group     BP      Pulse      Resp      Temp      Temp src      SpO2      Weight      Height      Head Circumference      Peak Flow      Pain Score 7     Pain Loc      Pain Edu?      Excl. in [place]?    No data found.  Updated Vital Signs BP Marland Kitchen)  145/77 (BP Location: Left Arm)    Pulse 66    Temp 98.6 F (37 C) (Oral)    Resp 18    SpO2 97%   Visual Acuity Right Eye Distance:   Left Eye Distance:   Bilateral Distance:    Right Eye Near:   Left Eye Near:    Bilateral Near:     Physical Exam Vitals and nursing note reviewed.  Constitutional:      General: He is not in acute distress.    Appearance: He is well-developed. He is not ill-appearing.  HENT:     Mouth/Throat:     Mouth: Mucous membranes are moist.  Cardiovascular:     Rate and Rhythm: Normal rate and regular rhythm.     Heart sounds: Normal heart sounds.  Pulmonary:     Effort: Pulmonary effort is normal. No respiratory distress.     Breath sounds: Normal breath sounds.  Abdominal:     General: Bowel sounds are normal. There is no distension.     Palpations: Abdomen is soft.     Tenderness: There is no abdominal tenderness. There is no guarding or rebound.  Musculoskeletal:         General: No swelling, tenderness, deformity or signs of injury. Normal range of motion.     Cervical back: Neck supple.  Skin:    General: Skin is warm and dry.     Capillary Refill: Capillary refill takes less than 2 seconds.     Findings: No bruising, erythema, lesion or rash.  Neurological:     General: No focal deficit present.     Mental Status: He is alert and oriented to person, place, and time.     Sensory: No sensory deficit.     Motor: No weakness.     Gait: Gait abnormal.     Comments: Limping gait.   Psychiatric:        Mood and Affect: Mood normal.        Behavior: Behavior normal.     UC Treatments / Results  Labs (all labs ordered are listed, but only abnormal results are displayed) Labs Reviewed - No data to display  EKG   Radiology No results found.  Procedures Procedures (including critical care time)  Medications Ordered in UC Medications - No data to display  Initial Impression / Assessment and Plan / UC Course  I have reviewed the triage vital signs and the nursing notes.  Pertinent labs & imaging results that were available during my care of the patient were reviewed by me and considered in my medical decision making (see chart for details).    Right side sciatica.  No injury or falls.  No indication of infection.  Treating with meloxicam and methocarbamol.  Precautions for drowsiness with methocarbamol discussed.  Instructed patient to follow up with his PCP or an orthopedist if his symptoms are not improving.  He agrees to plan of care.    Final Clinical Impressions(s) / UC Diagnoses   Final diagnoses:  Sciatica of right side     Discharge Instructions      Take the meloxicam as directed.    Take the methocarbamol as needed for muscle spasm; Do not drive, operate machinery, or drink alcohol with this medication as it can cause drowsiness.   Follow up with your primary care provider or an orthopedist if your symptoms are not improving.          ED Prescriptions     Medication  Sig Dispense Auth. Provider   methocarbamol (ROBAXIN) 500 MG tablet Take 1 tablet (500 mg total) by mouth 2 (two) times daily as needed for muscle spasms. 10 tablet Sharion Balloon, NP   meloxicam (MOBIC) 7.5 MG tablet Take 1 tablet (7.5 mg total) by mouth daily for 7 days. 7 tablet Sharion Balloon, NP      PDMP not reviewed this encounter.   Sharion Balloon, NP 01/29/22 1426

## 2022-01-29 NOTE — ED Triage Notes (Signed)
Pt presents with right side hip pain that radiates down his right leg difficult to walk. Pt denies any injury. Pt took ibuprofen for pain control this morning.

## 2022-01-29 NOTE — ED Triage Notes (Signed)
Formatting of this note might be different from the original.  Pt presents with right side hip pain that radiates down his right leg difficult to walk. Pt denies any injury. Pt took ibuprofen for pain control this morning.   Electronically signed by Lanna Poche, RN at 01/29/2022  1:37 PM EST

## 2022-01-29 NOTE — ED Provider Notes (Signed)
Formatting of this note is different from the original.  Images from the original note were not included.    Leonard Meyer    CSN: [id number]  Arrival date & time: 01/29/22  1301        History    Chief Complaint  Chief Complaint   Patient presents with    Hip Pain     HPI  Leonard Meyer is a 36 y.o. male.  Patient presents with right lower back pain that radiates down his right leg x 1 week.  No falls or injury.  The pain is worse with ambulation and improves with rest.  Treatment attempted at home with ibuprofen.  He denies numbness, weakness, saddle anesthesia, loss of bowel/bladder control, abdominal pain, dysuria, hematuria, fever, or other symptoms.  He denies history of similar symptoms.  His medical history includes left inguinal hernia repair 3 months ago.    The history is provided by the patient and medical records.     History reviewed. No pertinent past medical history.    Patient Active Problem List    Diagnosis Date Noted    Atypical pneumonia     Sepsis, unspecified organism (HCC) 07/29/2016    Hypokalemia 07/29/2016    Sore throat 07/29/2016    Sepsis (HCC) 07/29/2016    FUO (fever of unknown origin)      Past Surgical History:   Procedure Laterality Date    HERNIA REPAIR       Home Medications      Prior to Admission medications    Medication Sig Start Date End Date Taking? Authorizing Provider   meloxicam (MOBIC) 7.5 MG tablet Take 1 tablet (7.5 mg total) by mouth daily for 7 days. 01/29/22 02/05/22 Yes Mickie Bail, NP   methocarbamol (ROBAXIN) 500 MG tablet Take 1 tablet (500 mg total) by mouth 2 (two) times daily as needed for muscle spasms. 01/29/22  Yes Mickie Bail, NP   azithromycin (ZITHROMAX) 250 MG tablet Take 1 tablet (250 mg total) by mouth daily. 07/31/16   Alison Murray, MD     Family History  Family History   Problem Relation Age of Onset    Hypertension Mother      Social History  Social History     Tobacco Use    Smoking status: Never    Smokeless tobacco: Never   Vaping Use     Vaping Use: Never used   Substance Use Topics    Alcohol use: No     Alcohol/week: 0.0 standard drinks    Drug use: No     Allergies    Patient has no known allergies.    Review of Systems  Review of Systems   Constitutional:  Negative for chills and fever.   Respiratory:  Negative for cough and shortness of breath.    Cardiovascular:  Negative for chest pain and palpitations.   Gastrointestinal:  Negative for abdominal pain, nausea and vomiting.   Genitourinary:  Negative for dysuria and hematuria.   Musculoskeletal:  Positive for back pain and gait problem. Negative for joint swelling.   Skin:  Negative for color change and rash.   Neurological:  Negative for weakness and numbness.   All other systems reviewed and are negative.    Physical Exam  Triage Vital Signs  ED Triage Vitals [01/29/22 1335]   Enc Vitals Group      BP       Pulse       Resp  Temp       Temp src       SpO2       Weight       Height       Head Circumference       Peak Flow       Pain Score 7      Pain Loc       Pain Edu?       Excl. in GC?      No data found.    Updated Vital Signs  BP (!) 145/77 (BP Location: Left Arm)   Pulse 66   Temp 98.6 F (37 C) (Oral)   Resp 18   SpO2 97%     Visual Acuity  Right Eye Distance:    Left Eye Distance:    Bilateral Distance:      Right Eye Near:    Left Eye Near:     Bilateral Near:       Physical Exam  Vitals and nursing note reviewed.   Constitutional:       General: He is not in acute distress.     Appearance: He is well-developed. He is not ill-appearing.   HENT:      Mouth/Throat:      Mouth: Mucous membranes are moist.   Cardiovascular:      Rate and Rhythm: Normal rate and regular rhythm.      Heart sounds: Normal heart sounds.   Pulmonary:      Effort: Pulmonary effort is normal. No respiratory distress.      Breath sounds: Normal breath sounds.   Abdominal:      General: Bowel sounds are normal. There is no distension.      Palpations: Abdomen is soft.      Tenderness: There is no  abdominal tenderness. There is no guarding or rebound.   Musculoskeletal:         General: No swelling, tenderness, deformity or signs of injury. Normal range of motion.      Cervical back: Neck supple.   Skin:     General: Skin is warm and dry.      Capillary Refill: Capillary refill takes less than 2 seconds.      Findings: No bruising, erythema, lesion or rash.   Neurological:      General: No focal deficit present.      Mental Status: He is alert and oriented to person, place, and time.      Sensory: No sensory deficit.      Motor: No weakness.      Gait: Gait abnormal.      Comments: Limping gait.    Psychiatric:         Mood and Affect: Mood normal.         Behavior: Behavior normal.     UC Treatments / Results   Labs  (all labs ordered are listed, but only abnormal results are displayed)  Labs Reviewed - No data to display    EKG    Radiology  No results found.    Procedures  Procedures (including critical care time)    Medications Ordered in UC  Medications - No data to display    Initial Impression / Assessment and Plan / UC Course   I have reviewed the triage vital signs and the nursing notes.    Pertinent labs & imaging results that were available during my care of the patient were reviewed by me and considered in my medical  decision making (see chart for details).      Right side sciatica.  No injury or falls.  No indication of infection.  Treating with meloxicam and methocarbamol.  Precautions for drowsiness with methocarbamol discussed.  Instructed patient to follow up with his PCP or an orthopedist if his symptoms are not improving.  He agrees to plan of care.      Final Clinical Impressions(s) / UC Diagnoses     Final diagnoses:   Sciatica of right side     Discharge Instructions        Take the meloxicam as directed.      Take the methocarbamol as needed for muscle spasm; Do not drive, operate machinery, or drink alcohol with this medication as it can cause drowsiness.     Follow up with your  primary care provider or an orthopedist if your symptoms are not improving.      ED Prescriptions       Medication Sig Dispense Auth. Provider    methocarbamol (ROBAXIN) 500 MG tablet Take 1 tablet (500 mg total) by mouth 2 (two) times daily as needed for muscle spasms. 10 tablet Mickie Bail, NP    meloxicam (MOBIC) 7.5 MG tablet Take 1 tablet (7.5 mg total) by mouth daily for 7 days. 7 tablet Mickie Bail, NP         PDMP not reviewed this encounter.    Mickie Bail, NP  01/29/22 1426    Electronically signed by Mickie Bail, NP at 01/29/2022  2:26 PM EST

## 2022-02-26 NOTE — Addendum Note (Signed)
Addended by: Angela Cox on: 02/26/2022 08:51 AM     Modules accepted: Orders      Electronically signed by Angela Cox at 02/26/2022  8:51 AM EST

## 2022-02-26 NOTE — Progress Notes (Signed)
Formatting of this note is different from the original.  Subjective     HPI:  Leonard Meyer is a 36 y.o.  male who presents to the office with:    Chief Complaint   Patient presents with   ? sick     Began:Friday evening   Symptoms:sore throat, small cough, runny nose, low grade fever (last night)   At home tests done:No   Taking:tylenol, lemon juice with honey       Sore throat began Friday afternoon.  Reports that it is associated with any mild intermittent cough, runny nose and chills.  Patient reports a tactile fever at home.  No at-home testing.  Patient taking Tylenol and using lemon juice with honey with minimal relief.  Patient has not had any known exposures.  No vomiting or diarrhea.  No difficulty breathing.    PMH: Past medical history, Past surgical history, Social history, family history were reviewed as noted in EMR.  Pertinent for:   History reviewed. No pertinent past medical history.    Medications and allergies reviewed.    ROS: All systems were negative including constitutional, CV, respiratory, GI/GU except for those noted in the HPI.    Objective     Vital Signs: BP 129/78 (BP Location: Left arm, Patient Position: Sitting)   Pulse 79   Temp 98.5 F (36.9 C) (Temporal)   Ht 5\' 6"  (1.676 m)   Wt 188 lb 6.4 oz (85.5 kg)   SpO2 99%   BMI 30.41 kg/m    Wt Readings from Last 3 Encounters:   02/26/22 188 lb 6.4 oz (85.5 kg)   02/08/22 193 lb 3.2 oz (87.6 kg)   05/27/20 182 lb (82.6 kg)     Physical Exam  Vitals and nursing note reviewed.   Constitutional:       General: He is not in acute distress.  HENT:      Head: Normocephalic and atraumatic.      Mouth/Throat:      Mouth: Mucous membranes are moist.      Pharynx: Oropharynx is clear. No oropharyngeal exudate or posterior oropharyngeal erythema.   Cardiovascular:      Rate and Rhythm: Normal rate and regular rhythm.      Heart sounds: No murmur heard.  Pulmonary:      Effort: Pulmonary effort is normal.      Breath sounds: Normal breath  sounds. No wheezing.   Lymphadenopathy:      Cervical: No cervical adenopathy.   Skin:     General: Skin is warm and dry.   Neurological:      Mental Status: He is alert.   Psychiatric:         Mood and Affect: Mood and affect normal.         Cognition and Memory: Memory normal.         Judgment: Judgment normal.     Assessment/Plan     Orders Placed This Encounter   Procedures   ? Strep A Culture, Throat Throat Swab   ? COVID-19, NAA/PCR Nasopharyngeal Swab   ? POCT Rapid Strep A, Throat     1. Sore throat    2. Fever, unspecified fever cause    3. Cough, unspecified type      Patient with sore throat, tactile fever and mild cough.  Sore throat is the worst symptom.  No erythema or exudate.  No cervical lymphadenopathy.  Patient is afebrile.  Rapid strep is negative.  Culture sent  given prevalence in the community at this time of strep pharyngitis.  We will also send out COVID testing.  Follow-up pending results.  Continue with supportive care.  Patient is nontoxic and well-appearing otherwise.    No problem-specific Assessment & Plan notes found for this encounter.    Follow up if symptoms worsen or fail to improve.    No future appointments.    Medications at end of visit today:  No current outpatient medications on file.    Patient Care Team:  Misty Stanley, PA-C as PCP - General (Physician Assistant)      Electronically signed by Misty Stanley, PA-C at 02/26/2022  8:46 AM EST

## 2023-01-06 ENCOUNTER — Ambulatory Visit
Admit: 2023-01-06 | Discharge: 2023-01-06 | Payer: BLUE CROSS/BLUE SHIELD | Attending: Family Medicine | Primary: Diagnostic Radiology

## 2023-01-06 DIAGNOSIS — R051 Acute cough: Secondary | ICD-10-CM

## 2023-01-06 MED ORDER — AZELASTINE HCL 0.1 % NA SOLN
0.1 % | Freq: Two times a day (BID) | NASAL | 1 refills | Status: AC
Start: 2023-01-06 — End: ?

## 2023-01-06 MED ORDER — AMOXICILLIN-POT CLAVULANATE 875-125 MG PO TABS
875-125 MG | ORAL_TABLET | Freq: Two times a day (BID) | ORAL | 0 refills | Status: DC
Start: 2023-01-06 — End: 2023-01-06

## 2023-01-06 MED ORDER — AZELASTINE HCL 0.1 % NA SOLN
0.1 % | Freq: Two times a day (BID) | NASAL | 1 refills | Status: DC
Start: 2023-01-06 — End: 2023-01-06

## 2023-01-06 MED ORDER — AMOXICILLIN-POT CLAVULANATE 875-125 MG PO TABS
875-125 MG | ORAL_TABLET | Freq: Two times a day (BID) | ORAL | 0 refills | Status: AC
Start: 2023-01-06 — End: 2023-01-16

## 2023-01-06 NOTE — Progress Notes (Signed)
Subjective:sinus congestion      Patient ID: Leonard Meyer       Otalgia   Associated symptoms include coughing.   Cough  Associated symptoms include ear pain.    The patient is a 37 y.o. male with 2 weeks sinus pressure congestion and drainage, some left ear pain no fever no sob no wheezing using otc meds    Review of Systems   HENT:  Positive for ear pain.    Respiratory:  Positive for cough.    : As noted in the HPI    No past medical history on file.     No past surgical history on file.     No Known Allergies     Current Outpatient Medications   Medication Sig Dispense Refill    azelastine (ASTELIN) 0.1 % nasal spray 2 sprays by Nasal route 2 times daily Use in each nostril as directed 120 mL 1    amoxicillin-clavulanate (AUGMENTIN) 875-125 MG per tablet Take 1 tablet by mouth 2 times daily for 10 days 20 tablet 0     No current facility-administered medications for this visit.        BP 118/74 (Site: Left Upper Arm, Position: Sitting, Cuff Size: Medium Adult)   Pulse 68   Temp 97.6 F (36.4 C) (Oral)   Resp 18   Ht 1.702 m (5\' 7" )   Wt 83 kg (183 lb)   SpO2 99%   BMI 28.66 kg/m       Objective:   Physical Exam  Vitals reviewed.   Constitutional:       General: He is not in acute distress.     Appearance: Normal appearance. He is not ill-appearing, toxic-appearing or diaphoretic.   HENT:      Head: Normocephalic.      Right Ear: Tympanic membrane and ear canal normal.      Left Ear: Tympanic membrane and ear canal normal.      Nose: Nose normal.      Mouth/Throat:      Mouth: Mucous membranes are moist.      Pharynx: No oropharyngeal exudate or posterior oropharyngeal erythema.   Eyes:      General: No scleral icterus.        Right eye: No discharge.         Left eye: No discharge.      Extraocular Movements: Extraocular movements intact.      Conjunctiva/sclera: Conjunctivae normal.      Pupils: Pupils are equal, round, and reactive to light.   Cardiovascular:      Rate and Rhythm: Normal rate and  regular rhythm.      Pulses: Normal pulses.      Heart sounds: Normal heart sounds.   Pulmonary:      Effort: Pulmonary effort is normal.      Breath sounds: Normal breath sounds.   Skin:     General: Skin is warm and dry.   Neurological:      General: No focal deficit present.      Mental Status: He is alert.   Psychiatric:         Mood and Affect: Mood normal.         No scans are attached to the encounter.     Assessment:   1. Acute cough  2. Sore throat  3. Acute non-recurrent maxillary sinusitis       Plan:   No results found for any visits on 01/06/23.  Tx with augmentin mucinex and otc cough meds fu as needed       Leata Mouse, MD

## 2023-01-06 NOTE — Addendum Note (Signed)
Addended by: Leata Mouse on: 01/06/2023 11:20 AM     Modules accepted: Orders

## 2023-05-02 ENCOUNTER — Ambulatory Visit
Admit: 2023-05-02 | Discharge: 2023-05-02 | Payer: BLUE CROSS/BLUE SHIELD | Attending: Family Medicine | Primary: Diagnostic Radiology

## 2023-05-02 DIAGNOSIS — J029 Acute pharyngitis, unspecified: Secondary | ICD-10-CM

## 2023-05-02 LAB — AMB POC RAPID STREP A: Group A Strep Antigen, POC: NEGATIVE

## 2023-05-02 MED ORDER — AZITHROMYCIN 250 MG PO TABS
250 | ORAL_TABLET | ORAL | 0 refills | Status: AC
Start: 2023-05-02 — End: 2023-05-12

## 2023-05-02 NOTE — Progress Notes (Signed)
Subjective:sore throat      Patient ID: Leonard Meyer     HPI The patient is a 37 y.o. male with sore throat over past few days no nvd no rash no wheezing using otc meds no fever.     Review of Systems: As noted in the HPI    History reviewed. No pertinent past medical history.     History reviewed. No pertinent surgical history.     No Known Allergies     Current Outpatient Medications   Medication Sig Dispense Refill    azelastine (ASTELIN) 0.1 % nasal spray 2 sprays by Nasal route 2 times daily Use in each nostril as directed (Patient not taking: Reported on 05/02/2023) 120 mL 1     No current facility-administered medications for this visit.        BP 110/68   Pulse 60   Temp 97.8 F (36.6 C)   Resp 16   Ht 1.676 m (5\' 6" )   Wt 83.9 kg (185 lb)   SpO2 97%   BMI 29.86 kg/m       Objective:   Physical Exam  Vitals reviewed.   Constitutional:       General: He is not in acute distress.     Appearance: Normal appearance. He is not ill-appearing, toxic-appearing or diaphoretic.   HENT:      Head: Normocephalic.      Right Ear: Tympanic membrane and ear canal normal.      Left Ear: Tympanic membrane and ear canal normal.      Nose: Nose normal.      Mouth/Throat:      Mouth: Mucous membranes are moist.      Pharynx: Posterior oropharyngeal erythema present. No oropharyngeal exudate.      Comments: No pta  Eyes:      General: No scleral icterus.        Right eye: No discharge.         Left eye: No discharge.      Extraocular Movements: Extraocular movements intact.      Conjunctiva/sclera: Conjunctivae normal.      Pupils: Pupils are equal, round, and reactive to light.   Cardiovascular:      Rate and Rhythm: Normal rate and regular rhythm.      Heart sounds: Normal heart sounds.   Pulmonary:      Effort: Pulmonary effort is normal.      Breath sounds: Normal breath sounds.   Lymphadenopathy:      Cervical: Cervical adenopathy present.   Skin:     General: Skin is warm and dry.   Neurological:      General: No  focal deficit present.      Mental Status: He is alert.   Psychiatric:         Mood and Affect: Mood normal.         No scans are attached to the encounter.     Assessment:   1. Sore throat  -     POC Strep A Assay w/Optic (16109)       Plan:   No results found for any visits on 05/02/23.     Tx with zpak, tylenol motrin fluids fu as needed worsening symptoms       Mohammed Kindle, MD

## 2023-05-04 ENCOUNTER — Ambulatory Visit
Admit: 2023-05-04 | Discharge: 2023-05-04 | Payer: BLUE CROSS/BLUE SHIELD | Attending: Physician Assistant | Primary: Diagnostic Radiology

## 2023-05-04 DIAGNOSIS — J029 Acute pharyngitis, unspecified: Secondary | ICD-10-CM

## 2023-05-04 LAB — POC HETEROPHILE ANTIBODY SCREEN: Mononucleosis Screen, POC: NEGATIVE

## 2023-05-04 LAB — AMB POC RAPID STREP A: Group A Strep Antigen, POC: NEGATIVE

## 2023-05-04 MED ORDER — DEXAMETHASONE SOD PHOSPHATE PF 10 MG/ML IJ SOLN
10 | Freq: Once | INTRAMUSCULAR | Status: AC
Start: 2023-05-04 — End: 2023-05-04
  Administered 2023-05-04: 17:00:00 10 mg via INTRAMUSCULAR

## 2023-05-04 NOTE — Progress Notes (Signed)
05/04/23     [name] , male , 37 y.o.     37 yo male presents with sore throat x 3 days. Was seen here 2 days ago and prescribed Zithromax, advised to return if not improving. Denies fever, chills, body aches, cough or runny nose           Current Outpatient Medications   Medication Sig Dispense Refill    azithromycin (ZITHROMAX) 250 MG tablet 500mg  on day 1 followed by 250mg  on days 2 - 5 6 tablet 0    azelastine (ASTELIN) 0.1 % nasal spray 2 sprays by Nasal route 2 times daily Use in each nostril as directed (Patient not taking: Reported on 05/02/2023) 120 mL 1     No current facility-administered medications for this visit.        History reviewed. No pertinent past medical history.     History reviewed. No pertinent surgical history.     History reviewed. No pertinent family history.     Social History     Socioeconomic History    Marital status: Married     Spouse name: Not on file    Number of children: Not on file    Years of education: Not on file    Highest education level: Not on file   Occupational History    Not on file   Tobacco Use    Smoking status: Never     Passive exposure: Never    Smokeless tobacco: Never   Substance and Sexual Activity    Alcohol use: Not Currently    Drug use: Not Currently    Sexual activity: Not on file   Other Topics Concern    Not on file   Social History Narrative    Not on file     Social Determinants of Health     Financial Resource Strain: Not on file   Food Insecurity: Not on file   Transportation Needs: Not on file   Physical Activity: Not on file   Stress: Not on file   Social Connections: Not on file   Intimate Partner Violence: Not on file   Housing Stability: Not on file        No Known Allergies     Vitals:    05/04/23 1241   BP: 106/67   Pulse: 68   Resp: 16   Temp: 98.2 F (36.8 C)   SpO2: 100%        Physical Exam  Constitutional:       Appearance: Normal appearance.   HENT:      Head: Normocephalic and atraumatic.      Nose: Nose normal. No  congestion or rhinorrhea.      Mouth/Throat:      Mouth: Mucous membranes are moist.      Pharynx: Posterior oropharyngeal erythema present. No oropharyngeal exudate.   Cardiovascular:      Rate and Rhythm: Normal rate and regular rhythm.   Pulmonary:      Effort: Pulmonary effort is normal.      Breath sounds: Normal breath sounds.   Skin:     General: Skin is warm and dry.   Neurological:      General: No focal deficit present.      Mental Status: He is alert.   Psychiatric:         Mood and Affect: Mood normal.         Behavior: Behavior normal.  Thought Content: Thought content normal.          Results/Reports    Recent Results (from the past 24 hour(s))   POC Heterophile Antibodies (16109)    Collection Time: 05/04/23 12:00 AM   Result Value Ref Range    Internal Control pass     Mononucleosis Screen, POC negative    POC Strep A Assay w/Optic (60454)    Collection Time: 05/04/23 12:54 PM   Result Value Ref Range    Valid Internal Control, POC pass     Group A Strep Antigen, POC Negative    #    Assessment/Plan      Leonard Meyer was seen today for sore throat.    Diagnoses and all orders for this visit:    Sore throat  -     POC Strep A Assay w/Optic (09811)  -     POC Heterophile Antibodies (91478)  -     Culture, Throat; Future  -     dexAMETHasone (PF) (DECADRON) injection 10 mg         Take medication as prescribed. Prescription sent to pharmacy. Follow up for any new or worsening symptoms     Electronically signed by    Mackie Pai, PA-C

## 2023-05-05 LAB — CULTURE, THROAT: FINAL REPORT: NORMAL

## 2023-07-27 ENCOUNTER — Ambulatory Visit
Admit: 2023-07-27 | Discharge: 2023-07-27 | Payer: BLUE CROSS/BLUE SHIELD | Attending: Family Medicine | Primary: Diagnostic Radiology

## 2023-07-27 DIAGNOSIS — J0100 Acute maxillary sinusitis, unspecified: Secondary | ICD-10-CM

## 2023-07-27 MED ORDER — PROMETHAZINE-DM 6.25-15 MG/5ML PO SYRP
6.25-155 MG/5ML | Freq: Four times a day (QID) | ORAL | 0 refills | Status: AC | PRN
Start: 2023-07-27 — End: 2023-08-08

## 2023-07-27 MED ORDER — TRIAMCINOLONE ACETONIDE 55 MCG/ACT NA AERO
55 | Freq: Every day | NASAL | 1 refills | 30.00000 days | Status: DC
Start: 2023-07-27 — End: 2025-01-17

## 2023-07-27 MED ORDER — AMOXICILLIN-POT CLAVULANATE 875-125 MG PO TABS
875-125 | ORAL_TABLET | Freq: Two times a day (BID) | ORAL | 0 refills | Status: AC
Start: 2023-07-27 — End: 2023-08-03

## 2023-07-27 NOTE — Progress Notes (Signed)
Leonard Meyer is a 37 y.o. male presents today with   Chief Complaint   Patient presents with    Cough    Congestion     Productive cough and congestion for 2 weeks      .    HPI URI sx started two weeks ago.  Has cough, nasal congestion and sinus pressure.  Not taking any medication for his sx.  Son was sick before him.  Denies travel.      Current Outpatient Medications   Medication Sig Dispense Refill    amoxicillin-clavulanate (AUGMENTIN) 875-125 MG per tablet Take 1 tablet by mouth 2 times daily for 7 days 14 tablet 0    triamcinolone (NASACORT ALLERGY 24HR) 55 MCG/ACT nasal inhaler 1 spray by Each Nostril route daily 1 each 1    promethazine-dextromethorphan (PROMETHAZINE-DM) 6.25-15 MG/5ML syrup Take 5 mLs by mouth 4 times daily as needed for Cough 118 mL 0    azelastine (ASTELIN) 0.1 % nasal spray 2 sprays by Nasal route 2 times daily Use in each nostril as directed (Patient not taking: Reported on 05/02/2023) 120 mL 1     No current facility-administered medications for this visit.        No Known Allergies     History reviewed. No pertinent past medical history.     History reviewed. No pertinent surgical history.     Social History     Socioeconomic History    Marital status: Married     Spouse name: Not on file    Number of children: Not on file    Years of education: Not on file    Highest education level: Not on file   Occupational History    Not on file   Tobacco Use    Smoking status: Never     Passive exposure: Never    Smokeless tobacco: Never   Substance and Sexual Activity    Alcohol use: Not Currently    Drug use: Not Currently    Sexual activity: Not on file   Other Topics Concern    Not on file   Social History Narrative    Not on file     Social Determinants of Health     Financial Resource Strain: Not on file   Food Insecurity: Not on file   Transportation Needs: Not on file   Physical Activity: Not on file   Stress: Not on file   Social Connections: Not on file   Intimate Partner Violence:  Not on file   Housing Stability: Not on file        Review of Systems   Constitutional: Negative.  Negative for fever.   HENT:  Positive for congestion and sinus pain. Negative for rhinorrhea and sore throat.    Respiratory:  Positive for cough. Negative for shortness of breath and wheezing.    Cardiovascular:  Negative for chest pain.   Gastrointestinal:  Negative for diarrhea, nausea and vomiting.   Neurological:  Negative for headaches.        BP 112/68   Pulse 68   Temp 98.6 F (37 C) (Oral)   Resp 17   Ht 1.702 m (5\' 7" )   Wt 83.9 kg (185 lb)   SpO2 99%   BMI 28.98 kg/m      Physical Exam  Vitals and nursing note reviewed.   Constitutional:       Appearance: Normal appearance.   HENT:      Head: Normocephalic and atraumatic.  Right Ear: Tympanic membrane, ear canal and external ear normal.      Left Ear: Tympanic membrane, ear canal and external ear normal.      Nose: Congestion and rhinorrhea present. Rhinorrhea is purulent.      Right Turbinates: Swollen.      Right Sinus: Maxillary sinus tenderness present.      Left Sinus: Maxillary sinus tenderness present.      Mouth/Throat:      Pharynx: Posterior oropharyngeal erythema present. No oropharyngeal exudate.   Eyes:      Conjunctiva/sclera: Conjunctivae normal.   Cardiovascular:      Rate and Rhythm: Normal rate and regular rhythm.      Heart sounds: Normal heart sounds.   Pulmonary:      Effort: Pulmonary effort is normal.      Breath sounds: Normal breath sounds.   Musculoskeletal:      Cervical back: Normal range of motion and neck supple.   Skin:     General: Skin is warm and dry.      Findings: No rash.   Neurological:      General: No focal deficit present.      Mental Status: He is alert and oriented to person, place, and time.   Psychiatric:         Mood and Affect: Mood normal.         Behavior: Behavior normal.          1. Acute non-recurrent maxillary sinusitis  -     amoxicillin-clavulanate (AUGMENTIN) 875-125 MG per tablet; Take 1  tablet by mouth 2 times daily for 7 days, Disp-14 tablet, R-0Normal  -     triamcinolone (NASACORT ALLERGY 24HR) 55 MCG/ACT nasal inhaler; 1 spray by Each Nostril route daily, Disp-1 each, R-1Normal  -     promethazine-dextromethorphan (PROMETHAZINE-DM) 6.25-15 MG/5ML syrup; Take 5 mLs by mouth 4 times daily as needed for Cough, Disp-118 mL, R-0Normal       Visit Diagnoses         Codes    Acute non-recurrent maxillary sinusitis    -  Primary J01.00             No results found for any visits on 07/27/23.     Return if symptoms worsen or fail to improve.     Barbee Shropshire, DO

## 2024-05-17 ENCOUNTER — Ambulatory Visit
Admit: 2024-05-17 | Discharge: 2024-05-17 | Payer: BLUE CROSS/BLUE SHIELD | Attending: Family Medicine | Primary: Diagnostic Radiology

## 2024-05-17 VITALS — BP 123/76 | HR 72 | Temp 97.80000°F | Resp 17 | Ht 67.0 in | Wt 187.7 lb

## 2024-05-17 DIAGNOSIS — J329 Chronic sinusitis, unspecified: Secondary | ICD-10-CM

## 2024-05-17 MED ORDER — PROMETHAZINE-DM 6.25-15 MG/5ML PO SYRP
6.25-15 | Freq: Four times a day (QID) | ORAL | 0 refills | 7.00000 days | Status: DC | PRN
Start: 2024-05-17 — End: 2025-01-17

## 2024-05-17 MED ORDER — TRIAMCINOLONE ACETONIDE 55 MCG/ACT NA AERO
55 | Freq: Every day | NASAL | 0 refills | 30.00000 days | Status: DC
Start: 2024-05-17 — End: 2025-01-17

## 2024-05-17 MED ORDER — AMOXICILLIN-POT CLAVULANATE 875-125 MG PO TABS
875-125 | ORAL_TABLET | Freq: Two times a day (BID) | ORAL | 0 refills | 8.50000 days | Status: AC
Start: 2024-05-17 — End: 2024-05-24

## 2024-05-17 NOTE — Progress Notes (Signed)
 Leonard Meyer is a 38 y.o. male presents today with   Chief Complaint   Patient presents with    Cough     Started a week ago, yellow phlegm.     Nasal Congestion    .    HPI Pt has URI sx that started a week ago.  Taking tylenol, nasal spray, and mucinex fast max.  Denies recent travel or known exposure.  Reports subjective fever.    Current Outpatient Medications   Medication Sig Dispense Refill    dicyclomine (BENTYL) 20 MG tablet TAKE 1 TABLET BY MOUTH EVERY 6 HOURS AS NEEDED FOR BLOATING      amoxicillin -clavulanate (AUGMENTIN ) 875-125 MG per tablet Take 1 tablet by mouth 2 times daily for 7 days 14 tablet 0    promethazine -dextromethorphan (PROMETHAZINE -DM) 6.25-15 MG/5ML syrup Take 5 mLs by mouth 4 times daily as needed for Cough 180 mL 0    triamcinolone  (NASACORT ) 55 MCG/ACT nasal inhaler 2 sprays by Each Nostril route daily 1 each 0    triamcinolone  (NASACORT  ALLERGY 24HR) 55 MCG/ACT nasal inhaler 1 spray by Each Nostril route daily 1 each 1    azelastine  (ASTELIN ) 0.1 % nasal spray 2 sprays by Nasal route 2 times daily Use in each nostril as directed (Patient not taking: Reported on 05/17/2024) 120 mL 1     No current facility-administered medications for this visit.        No Known Allergies     History reviewed. No pertinent past medical history.     History reviewed. No pertinent surgical history.     Social History     Socioeconomic History    Marital status: Married     Spouse name: Not on file    Number of children: Not on file    Years of education: Not on file    Highest education level: Not on file   Occupational History    Not on file   Tobacco Use    Smoking status: Never     Passive exposure: Never    Smokeless tobacco: Never   Vaping Use    Vaping status: Never Used   Substance and Sexual Activity    Alcohol use: Not Currently    Drug use: Not Currently    Sexual activity: Not on file   Other Topics Concern    Not on file   Social History Narrative    Not on file     Social Drivers of Health      Financial Resource Strain: Not on file   Food Insecurity: No Food Insecurity (02/08/2022)    Received from [facility], [facility]    Hunger Vital Sign     Worried About Running Out of Food in the Last Year: Never true     Ran Out of Food in the Last Year: Never true   Transportation Needs: Not on file   Physical Activity: Not on file   Stress: Not on file   Social Connections: Unknown (05/08/2022)    Received from [facility], [facility]    Social Network     Social Network: Not on file   Intimate Partner Violence: Unknown (03/30/2022)    Received from [facility], [facility]    HITS     Physically Hurt: Not on file     Insult or Talk Down To: Not on file     Threaten Physical Harm: Not on file     Scream or Curse: Not on file  Housing Stability: Not on file        Review of Systems   Constitutional:  Positive for fever.   HENT:  Positive for congestion, rhinorrhea and sinus pain. Negative for sore throat.    Respiratory:  Positive for cough. Negative for shortness of breath and wheezing.    Cardiovascular:  Negative for chest pain.   Gastrointestinal:  Negative for diarrhea, nausea and vomiting.   Neurological:  Negative for headaches.        BP 123/76   Pulse 72   Temp 97.8 F (36.6 C) (Oral)   Resp 17   Ht 1.702 m (5\' 7" )   Wt 85.1 kg (187 lb 11.2 oz)   SpO2 98%   BMI 29.40 kg/m      Physical Exam  Vitals and nursing note reviewed.   Constitutional:       Appearance: Normal appearance.   HENT:      Head: Normocephalic and atraumatic.      Right Ear: Tympanic membrane, ear canal and external ear normal.      Left Ear: Tympanic membrane, ear canal and external ear normal.      Nose: Congestion and rhinorrhea present. Rhinorrhea is purulent.      Right Turbinates: Swollen.      Right Sinus: Maxillary sinus tenderness present.      Left Sinus: Maxillary sinus tenderness present.      Mouth/Throat:      Pharynx: Posterior oropharyngeal erythema present. No oropharyngeal exudate.   Eyes:       Conjunctiva/sclera: Conjunctivae normal.   Cardiovascular:      Rate and Rhythm: Normal rate and regular rhythm.      Heart sounds: Normal heart sounds.   Pulmonary:      Effort: Pulmonary effort is normal.      Breath sounds: Normal breath sounds.   Musculoskeletal:      Cervical back: Normal range of motion and neck supple.   Skin:     General: Skin is warm and dry.      Findings: No rash.   Neurological:      General: No focal deficit present.      Mental Status: He is alert and oriented to person, place, and time.   Psychiatric:         Mood and Affect: Mood normal.         Behavior: Behavior normal.          1. Sinusitis, unspecified chronicity, unspecified location  -     amoxicillin -clavulanate (AUGMENTIN ) 875-125 MG per tablet; Take 1 tablet by mouth 2 times daily for 7 days, Disp-14 tablet, R-0Normal  -     promethazine -dextromethorphan (PROMETHAZINE -DM) 6.25-15 MG/5ML syrup; Take 5 mLs by mouth 4 times daily as needed for Cough, Disp-180 mL, R-0Normal  -     triamcinolone  (NASACORT ) 55 MCG/ACT nasal inhaler; 2 sprays by Each Nostril route daily, Disp-1 each, R-0Normal       Visit Diagnoses         Codes      Sinusitis, unspecified chronicity, unspecified location    -  Primary J32.9             No results found for any visits on 05/17/24.     No follow-ups on file.     Wess Hammed, DO

## 2024-08-11 ENCOUNTER — Ambulatory Visit: Admit: 2024-08-11 | Discharge: 2024-08-11 | Payer: BLUE CROSS/BLUE SHIELD | Primary: Diagnostic Radiology

## 2024-08-11 VITALS — BP 117/77 | HR 69 | Temp 98.30000°F | Resp 17 | Ht 67.0 in | Wt 179.0 lb

## 2024-08-11 DIAGNOSIS — H5789 Other specified disorders of eye and adnexa: Principal | ICD-10-CM

## 2024-08-11 MED ORDER — OFLOXACIN 0.3 % OP SOLN
0.3 | Freq: Four times a day (QID) | OPHTHALMIC | 0 refills | 20.00000 days | Status: AC
Start: 2024-08-11 — End: 2024-08-18

## 2024-08-11 MED ORDER — ERYTHROMYCIN 5 MG/GM OP OINT
5 | OPHTHALMIC | 0 refills | Status: DC
Start: 2024-08-11 — End: 2024-08-11

## 2024-08-11 NOTE — Progress Notes (Signed)
 Subjective:      Patient ID: Leonard Meyer     Chief Complaint Eye Problem (2 days ago changing oil under car not sure if anything fell on the pt eyes,  both   eyes  left hand side  sensitivity o light and painful )     Time Patient seen by Provider:3:55 PM       Eye Problem   Associated symptoms include an eye discharge, eye redness and photophobia.    The patient is a 38 y.o. male with no sig pmhx presenting to the [facility] with left eye irritation x 2 days. Pt states that he was working under a car and was wearing his safety glasses. States that something fell into his eyes and he immediately got up and washed out his eyes. States that his right eye felt better however his left eye still felt irritated. He has no visual changes but states that it keeps tearing and it is sensitive to light. Pt denies fever, chills, weakness, numbness, tingling, worsening complaints at this time.     Review of Systems   Eyes:  Positive for photophobia, discharge and redness.   All other systems reviewed and are negative.      Past Medical History:   Diagnosis Date    GERD (gastroesophageal reflux disease)         History reviewed. No pertinent surgical history.     No Known Allergies     Current Outpatient Medications   Medication Sig Dispense Refill    ofloxacin (OCUFLOX) 0.3 % solution Place 2 drops into the left eye 4 times daily for 7 days 5 mL 0    dicyclomine (BENTYL) 20 MG tablet TAKE 1 TABLET BY MOUTH EVERY 6 HOURS AS NEEDED FOR BLOATING      promethazine -dextromethorphan (PROMETHAZINE -DM) 6.25-15 MG/5ML syrup Take 5 mLs by mouth 4 times daily as needed for Cough 180 mL 0    triamcinolone  (NASACORT ) 55 MCG/ACT nasal inhaler 2 sprays by Each Nostril route daily (Patient not taking: Reported on 08/11/2024) 1 each 0    triamcinolone  (NASACORT  ALLERGY 24HR) 55 MCG/ACT nasal inhaler 1 spray by Each Nostril route daily (Patient not taking: Reported on 08/11/2024) 1 each 1    azelastine  (ASTELIN ) 0.1 % nasal spray 2 sprays by Nasal route  2 times daily Use in each nostril as directed (Patient not taking: Reported on 05/17/2024) 120 mL 1     No current facility-administered medications for this visit.        BP 117/77   Pulse 69   Temp 98.3 F (36.8 C)   Resp 17   Ht 1.702 m (5' 7)   Wt 81.2 kg (179 lb)   SpO2 99%   BMI 28.04 kg/m       Objective:   Physical Exam  Vitals and nursing note reviewed.   Constitutional:       Appearance: Normal appearance.   HENT:      Head: Normocephalic and atraumatic.      Mouth/Throat:      Mouth: Mucous membranes are moist.   Eyes:      Extraocular Movements: Extraocular movements intact.      Pupils: Pupils are equal, round, and reactive to light.      Comments: Left eye injected   Small pinpoint foreign body noted (please see photo).   Cardiovascular:      Rate and Rhythm: Normal rate and regular rhythm.      Pulses: Normal pulses.  Heart sounds: Normal heart sounds.   Pulmonary:      Effort: Pulmonary effort is normal.      Breath sounds: Normal breath sounds.   Skin:     Capillary Refill: Capillary refill takes less than 2 seconds.   Neurological:      General: No focal deficit present.      Mental Status: He is alert and oriented to person, place, and time.               Assessment/Plan:   1. Eye irritation  -     [name], MD - Ophthalmology  -     ofloxacin (OCUFLOX) 0.3 % solution; Place 2 drops into the left eye 4 times daily for 7 days, Disp-5 mL, R-0Normal     Supportive care discussed  FU with optho with appt tomorrow AM (unable to see patient today), appt with [facility] at 830am tomorrow   ER precautions discussed     No results found for any visits on 08/11/24.     I have reviewed prior visit notes and lab results pertinent to this visit.    Patient advised for any new, worsening or recurring symptoms to return to the nearest Emergency Department or Urgent Care. Patient advised regarding lab results, side effects of medications, diagnosis, and follow up. Pt verbalized  understanding and return precautions discussed.     [name], PA-C

## 2025-01-17 ENCOUNTER — Ambulatory Visit: Admit: 2025-01-17 | Discharge: 2025-01-18 | Payer: BLUE CROSS/BLUE SHIELD | Primary: Diagnostic Radiology

## 2025-01-17 ENCOUNTER — Encounter

## 2025-01-17 VITALS — BP 113/74 | HR 73 | Temp 98.30000°F | Resp 17 | Ht 67.0 in | Wt 184.0 lb

## 2025-01-17 DIAGNOSIS — R051 Acute cough: Principal | ICD-10-CM

## 2025-01-17 LAB — POCT INFLUENZA A/B DNA
Influenza virus A RNA: NEGATIVE
Influenza virus B RNA: NEGATIVE

## 2025-01-17 MED ORDER — PROMETHAZINE-DM 6.25-15 MG/5ML PO SYRP
6.25-15 | Freq: Four times a day (QID) | ORAL | 0 refills | Status: AC | PRN
Start: 2025-01-17 — End: ?

## 2025-01-17 NOTE — Progress Notes (Signed)
 CHIEF COMPLAINT:  Chief Complaint   Patient presents with    Cough     Last night runny nose and cough    tired  ,   mucus         HISTORY OF PRESENT ILLNESS:  39 yo male presents to clinic with c/o URI sx x yesterday.  Sx include rhinorrhea, Cough, PND, globus sensation, and fatigue.  He denies fev, chills, SOB, wheezing, NVD, or rash. He is eating and drinking well.  Has not tried anything for sx.  Similar sx in past treated well with promethazine  DM.  Requesting refill.    CURRENT MEDICATION LIST:    Current Outpatient Medications   Medication Sig Dispense Refill    promethazine -dextromethorphan (PROMETHAZINE -DM) 6.25-15 MG/5ML syrup Take 5 mLs by mouth 4 times daily as needed for Cough 180 mL 0    azelastine  (ASTELIN ) 0.1 % nasal spray 2 sprays by Nasal route 2 times daily Use in each nostril as directed (Patient not taking: Reported on 05/17/2024) 120 mL 1     No current facility-administered medications for this visit.        ALLERGIES:    No Known Allergies     HISTORY:  Past Medical History:   Diagnosis Date    GERD (gastroesophageal reflux disease)       History reviewed. No pertinent surgical history.   Social History     Socioeconomic History    Marital status: Married     Spouse name: Not on file    Number of children: Not on file    Years of education: Not on file    Highest education level: Not on file   Occupational History    Not on file   Tobacco Use    Smoking status: Never     Passive exposure: Never    Smokeless tobacco: Never   Vaping Use    Vaping status: Never Used   Substance and Sexual Activity    Alcohol use: Not Currently    Drug use: Not Currently    Sexual activity: Not on file   Other Topics Concern    Not on file   Social History Narrative    Not on file     Social Drivers of Health     Financial Resource Strain: Not on file   Food Insecurity: No Food Insecurity (02/08/2022)    Received from [facility]    Hunger Vital Sign     Within the past 12 months, you worried that your food  would run out before you got the money to buy more.: Never true     Within the past 12 months, the food you bought just didn't last and you didn't have money to get more.: Never true   Transportation Needs: Not on file   Physical Activity: Not on file   Stress: Not on file   Social Connections: Unknown (05/08/2022)    Received from [facility]    Social Network     Social Network: Not on file   Intimate Partner Violence: Unknown (03/30/2022)    Received from [facility]    HITS     Physically Hurt: Not on file     Insult or Talk Down To: Not on file     Threaten Physical Harm: Not on file     Scream or Curse: Not on file   Housing Stability: Not on file      History reviewed. No pertinent family history.  REVIEW OF SYSTEMS:  Review of Systems   Respiratory:  Positive for cough.    Per HPI     PHYSICAL EXAM:  Physical Exam  Vitals and nursing note reviewed.   Constitutional:       General: He is not in acute distress.     Appearance: Normal appearance. He is not ill-appearing, toxic-appearing or diaphoretic.   HENT:      Head: Normocephalic and atraumatic.      Right Ear: Ear canal and external ear normal. A middle ear effusion is present.      Left Ear: Ear canal and external ear normal. A middle ear effusion is present.      Nose: Nose normal.      Right Sinus: No maxillary sinus tenderness or frontal sinus tenderness.      Left Sinus: No maxillary sinus tenderness or frontal sinus tenderness.      Mouth/Throat:      Mouth: Mucous membranes are moist.      Pharynx: Uvula midline. Postnasal drip present.   Cardiovascular:      Rate and Rhythm: Normal rate and regular rhythm.      Heart sounds: Normal heart sounds.   Pulmonary:      Effort: Pulmonary effort is normal.      Breath sounds: Normal breath sounds.   Musculoskeletal:      Cervical back: Normal range of motion and neck supple. No rigidity or tenderness.   Lymphadenopathy:      Cervical: No cervical adenopathy.   Skin:     General: Skin is warm and dry.    Neurological:      General: No focal deficit present.      Mental Status: He is alert and oriented to person, place, and time. Mental status is at baseline.   Psychiatric:         Mood and Affect: Mood normal.         Behavior: Behavior normal.         Judgment: Judgment normal.        Vital Signs -   Visit Vitals  BP 113/74   Pulse 73   Temp 98.3 F (36.8 C)   Resp 17   Ht 1.702 m (5' 7)   Wt 83.5 kg (184 lb)   SpO2 98%   BMI 28.82 kg/m            LABS  Results for orders placed or performed in visit on 01/17/25   Influenza A/B DNA (ID NOW)   Result Value Ref Range    Influenza virus A RNA Negative     Influenza virus B RNA Negative          TREATMENT:    1. Acute cough  -     Influenza A/B DNA (ID NOW)  -     promethazine -dextromethorphan (PROMETHAZINE -DM) 6.25-15 MG/5ML syrup; Take 5 mLs by mouth 4 times daily as needed for Cough, Disp-180 mL, R-0Normal  -     Referral for No Primary Care Physician       MDM/EDUCATION:   PE findings, test results, and treatment plan discussed with patient/parent/SO.  Overall PE reassuring.  Viral URI. Sx manage. Education given.  Script sent to pharmacy.  RTC/ED precautions given.  Patient/parent/SO vu above and agrees with current plan.      Follow up and Dispositions:  Return if symptoms worsen or fail to improve.       Montie Anette Flesher, FNP  I have reviewed prior visit notes and lab results pertinent to this visit. Point of care results and imaging were independently interpreted by myself and discussed with the patient. Educated the patient on disease process, expected course of illness, and management. Educated the patient on how to administer prescribed medications and possible side effects.  Patient instructed to follow-up immediately for any new or worsening symptoms.  Patient verbalized understanding and agreement with plan of care.    Please note that this note was generated using voice recognition Scientist, clinical (histocompatibility and immunogenetics).  Although every effort was made to  ensure the accuracy of this automated transcription, some errors in transcription may have occurred.

## 2025-01-26 ENCOUNTER — Encounter: Payer: BLUE CROSS/BLUE SHIELD | Primary: Diagnostic Radiology
